# Patient Record
Sex: Female | Born: 1935 | Race: Black or African American | Hispanic: No | Smoking: Former smoker
Health system: Southern US, Community
[De-identification: ages and names within clinical notes are randomized; demographics above are authoritative.]

## PROBLEM LIST (undated history)

## (undated) DIAGNOSIS — H3530 Unspecified macular degeneration: Secondary | ICD-10-CM

## (undated) DIAGNOSIS — R609 Edema, unspecified: Secondary | ICD-10-CM

## (undated) DIAGNOSIS — H33302 Unspecified retinal break, left eye: Secondary | ICD-10-CM

## (undated) DIAGNOSIS — Z5189 Encounter for other specified aftercare: Secondary | ICD-10-CM

## (undated) DIAGNOSIS — K08109 Complete loss of teeth, unspecified cause, unspecified class: Secondary | ICD-10-CM

## (undated) DIAGNOSIS — E785 Hyperlipidemia, unspecified: Secondary | ICD-10-CM

## (undated) DIAGNOSIS — N189 Chronic kidney disease, unspecified: Secondary | ICD-10-CM

## (undated) DIAGNOSIS — D649 Anemia, unspecified: Secondary | ICD-10-CM

## (undated) DIAGNOSIS — R112 Nausea with vomiting, unspecified: Secondary | ICD-10-CM

## (undated) DIAGNOSIS — Z87448 Personal history of other diseases of urinary system: Secondary | ICD-10-CM

## (undated) DIAGNOSIS — R7303 Prediabetes: Secondary | ICD-10-CM

## (undated) DIAGNOSIS — Z972 Presence of dental prosthetic device (complete) (partial): Secondary | ICD-10-CM

## (undated) DIAGNOSIS — K219 Gastro-esophageal reflux disease without esophagitis: Secondary | ICD-10-CM

## (undated) DIAGNOSIS — I10 Essential (primary) hypertension: Secondary | ICD-10-CM

## (undated) DIAGNOSIS — C801 Malignant (primary) neoplasm, unspecified: Secondary | ICD-10-CM

## (undated) DIAGNOSIS — Z9889 Other specified postprocedural states: Secondary | ICD-10-CM

## (undated) HISTORY — DX: Hyperlipidemia, unspecified: E78.5

## (undated) HISTORY — DX: Encounter for other specified aftercare: Z51.89

## (undated) HISTORY — DX: Gastro-esophageal reflux disease without esophagitis: K21.9

## (undated) HISTORY — DX: Unspecified macular degeneration: H35.30

## (undated) HISTORY — DX: Chronic kidney disease, unspecified: N18.9

## (undated) HISTORY — DX: Anemia, unspecified: D64.9

## (undated) HISTORY — DX: Essential (primary) hypertension: I10

## (undated) HISTORY — DX: Unspecified retinal break, left eye: H33.302

---

## 1975-08-28 HISTORY — PX: ABDOMINAL HYSTERECTOMY: SHX81

## 2000-02-05 ENCOUNTER — Encounter: Payer: Self-pay | Admitting: Internal Medicine

## 2000-02-05 ENCOUNTER — Encounter: Admission: RE | Admit: 2000-02-05 | Discharge: 2000-02-05 | Payer: Self-pay | Admitting: Internal Medicine

## 2000-06-21 ENCOUNTER — Ambulatory Visit (HOSPITAL_COMMUNITY): Admission: RE | Admit: 2000-06-21 | Discharge: 2000-06-21 | Payer: Self-pay | Admitting: Gastroenterology

## 2000-06-21 ENCOUNTER — Encounter: Payer: Self-pay | Admitting: Gastroenterology

## 2001-02-05 ENCOUNTER — Encounter: Admission: RE | Admit: 2001-02-05 | Discharge: 2001-02-05 | Payer: Self-pay | Admitting: Internal Medicine

## 2001-02-05 ENCOUNTER — Encounter: Payer: Self-pay | Admitting: Internal Medicine

## 2001-05-01 ENCOUNTER — Other Ambulatory Visit: Admission: RE | Admit: 2001-05-01 | Discharge: 2001-05-01 | Payer: Self-pay | Admitting: Obstetrics and Gynecology

## 2002-02-09 ENCOUNTER — Encounter: Payer: Self-pay | Admitting: Internal Medicine

## 2002-02-09 ENCOUNTER — Encounter: Admission: RE | Admit: 2002-02-09 | Discharge: 2002-02-09 | Payer: Self-pay | Admitting: Internal Medicine

## 2002-06-17 ENCOUNTER — Other Ambulatory Visit: Admission: RE | Admit: 2002-06-17 | Discharge: 2002-06-17 | Payer: Self-pay | Admitting: Obstetrics and Gynecology

## 2003-02-15 ENCOUNTER — Encounter: Payer: Self-pay | Admitting: Internal Medicine

## 2003-02-15 ENCOUNTER — Encounter: Admission: RE | Admit: 2003-02-15 | Discharge: 2003-02-15 | Payer: Self-pay | Admitting: Internal Medicine

## 2004-02-17 ENCOUNTER — Encounter: Admission: RE | Admit: 2004-02-17 | Discharge: 2004-02-17 | Payer: Self-pay | Admitting: Internal Medicine

## 2005-03-02 ENCOUNTER — Encounter: Admission: RE | Admit: 2005-03-02 | Discharge: 2005-03-02 | Payer: Self-pay | Admitting: Internal Medicine

## 2006-03-04 ENCOUNTER — Encounter: Admission: RE | Admit: 2006-03-04 | Discharge: 2006-03-04 | Payer: Self-pay | Admitting: Internal Medicine

## 2007-02-19 ENCOUNTER — Ambulatory Visit: Payer: Self-pay | Admitting: Gastroenterology

## 2007-03-06 ENCOUNTER — Encounter: Admission: RE | Admit: 2007-03-06 | Discharge: 2007-03-06 | Payer: Self-pay | Admitting: Internal Medicine

## 2007-03-12 ENCOUNTER — Encounter: Payer: Self-pay | Admitting: Gastroenterology

## 2007-03-12 ENCOUNTER — Ambulatory Visit: Payer: Self-pay | Admitting: Gastroenterology

## 2008-03-09 ENCOUNTER — Encounter: Admission: RE | Admit: 2008-03-09 | Discharge: 2008-03-09 | Payer: Self-pay | Admitting: Internal Medicine

## 2009-03-10 ENCOUNTER — Encounter: Admission: RE | Admit: 2009-03-10 | Discharge: 2009-03-10 | Payer: Self-pay | Admitting: Internal Medicine

## 2010-03-20 ENCOUNTER — Encounter: Admission: RE | Admit: 2010-03-20 | Discharge: 2010-03-20 | Payer: Self-pay | Admitting: Internal Medicine

## 2010-10-31 ENCOUNTER — Observation Stay (HOSPITAL_COMMUNITY)
Admission: RE | Admit: 2010-10-31 | Discharge: 2010-10-31 | Disposition: A | Discharge status: Home / Self Care | Payer: Medicare Other | Source: Ambulatory Visit | Attending: Cardiology | Admitting: Cardiology

## 2010-10-31 DIAGNOSIS — I10 Essential (primary) hypertension: Secondary | ICD-10-CM | POA: Insufficient documentation

## 2010-10-31 DIAGNOSIS — I059 Rheumatic mitral valve disease, unspecified: Secondary | ICD-10-CM | POA: Insufficient documentation

## 2010-10-31 DIAGNOSIS — E7800 Pure hypercholesterolemia, unspecified: Secondary | ICD-10-CM | POA: Insufficient documentation

## 2010-10-31 DIAGNOSIS — E785 Hyperlipidemia, unspecified: Secondary | ICD-10-CM | POA: Insufficient documentation

## 2010-10-31 DIAGNOSIS — R0602 Shortness of breath: Secondary | ICD-10-CM | POA: Insufficient documentation

## 2010-10-31 DIAGNOSIS — I428 Other cardiomyopathies: Principal | ICD-10-CM | POA: Insufficient documentation

## 2010-10-31 LAB — POCT I-STAT 3, ART BLOOD GAS (G3+): pH, Arterial: 7.429 — ABNORMAL HIGH (ref 7.350–7.400)

## 2010-10-31 LAB — POCT I-STAT 3, VENOUS BLOOD GAS (G3P V)
Acid-Base Excess: 4 mmol/L — ABNORMAL HIGH (ref 0.0–2.0)
Bicarbonate: 29.6 mEq/L — ABNORMAL HIGH (ref 20.0–24.0)
TCO2: 31 mmol/L (ref 0–100)
pH, Ven: 7.425 — ABNORMAL HIGH (ref 7.250–7.300)

## 2010-11-20 NOTE — Procedures (Signed)
Janet Barnes NO.:  [id number]  MEDICAL RECORD NO.:  [id number]           PATIENT TYPE:  I  LOCATION:  6522                         FACILITY:  [facility]  PHYSICIAN:  Vonna Kotyk R. Jacinto Halim, MD       DATE OF BIRTH:  [birth year]  DATE OF PROCEDURE:  10/31/2010 DATE OF DISCHARGE:                           CARDIAC CATHETERIZATION   REFERRING PHYSICIAN:  Merlene Laughter. Renae Gloss, MD  PROCEDURE PERFORMED: 1. Right heart catheterization. 2. Left heart catheterization including,     a.     Left ventriculography.     b.     Selective right and left coronary arteriography.  INDICATIONS:  [name] is a very pleasant 77 year old young female who has mild hypertension, hyperlipidemia who was referred to me for evaluation of shortness of breath.  She had undergone outpatient stress testing and echocardiogram, which had revealed severe LV systolic dysfunction, moderately severe mitral regurgitation.  Given this, she is now brought to the cardiac catheterization lab to evaluate her coronary anatomy.  RIGHT HEART CATHETERIZATION:  Hemodynamic data:  RA pressure was 8/3 with a mean of 5 mmHg.  RV pressure 18/3 with an end-diastolic pressure of 5 mmHg.  PA pressure 16/5 with a mean of 12 mmHg.  PA saturation was 68%.  Pulmonary capillary wedge 8/8, mean of 7 mmHg.  Aortic saturation was 96%.  Cardiac output by Fick 4.69 with a cardiac index at 2.61, which is normal.  HEMODYNAMIC DATA:  Left heart catheterization.  Left ventricular pressure was 126/4 with end-diastolic pressure of 7 mmHg.  Aortic pressure was 124/65 with a mean of 88 mmHg.  There was no pressure gradient across the aortic valve.  ANGIOGRAPHIC DATA:  Left ventricle.  Left ventricular systolic function was moderately depressed with ejection fraction of 35% to 40% with mild global hypokinesis.  Mild mitral regurgitation was evident.  A total of 36 mL of contrast was utilized for performing left  ventriculography. Ectopy was also evident.  Right coronary artery.  Right coronary artery is codominant with circumflex coronary artery.  There is mild luminal irregularity in the proximal right coronary artery.  Left main coronary artery.  Left main coronary artery is a large vessel, which is smooth and normal.  LAD.  LAD is a large vessel.  It gives origin to large diagonal one and a moderate-sized diagonal two.  Smooth and normal.  Ramus intermedius.  Ramus intermedius is a moderate-caliber vessel, which is smooth and normal.  Left circumflex.  Left circumflex coronary artery is codominant with right coronary artery.  It gives origin to a high AV groove branch.  It continues as a large obtuse marginal one, which has secondary branches. Again mild luminal irregularity was evident.  IMPRESSION: 1. Findings are consistent with nonischemic cardiomyopathy.  Ejection     fraction 35% to 40%. 2. No evidence of pulmonary hypertension by right heart     catheterization.  Preserved cardiac output and cardiac index. 3. Normal left ventricular end-diastolic pressure.  The patient is     well compensated in regards to congestive heart failure.  RECOMMENDATIONS:  Based on the data, continued aggressive medical  therapy is indicated.  The patient will be followed up in the outpatient basis.  TECHNICAL PROCEDURE:  Under sterile precaution using a 5-French right antecubital vein access and a 5-French right radial access, right and left heart catheterization was performed respectively via these sites. A 5-French balloon-tipped Swan-Ganz catheter was advanced into the right atrium and right ventricle and eventually into the pulmonary capillary wedge position without any complication.  Right heart catheterization was performed.  Hemodynamics were carefully analyzed and the catheter was then left in place until the left heart catheterization was done and then eventually pulled out of the body  holding manual pressure into the right antecubital vein.  Left heart catheterization was performed using a 5-French Tig #4 catheter.  Left ventriculography performed using hand contrast injection, then eventually the catheter was pulled into the ascending aorta.  Right coronary artery was selectively engaged and angiography was performed and the catheter was pulled out of the body over an exchange length J-wire.  A 5-French Judkins left four diagnostic catheter was utilized to engage left main coronary artery and angiography was performed.  Catheter was then pulled out of the body over a J-wire.  Left ventriculography was again repeated using a 5- French pigtail catheter in which the catheter exchange was again done over a J-wire.  RAO projection was utilized.  The patient tolerated the procedure well.  There was no immediate complication.     Cristy Hilts. Jacinto Halim, MD     JRG/MEDQ  D:  10/31/2010  T:  10/31/2010  Job:  [id number]  cc:   Merlene Laughter. Renae Gloss, M.D.  Electronically Signed by Yates Decamp MD on 11/20/2010 09:48:50 AM

## 2011-02-09 ENCOUNTER — Other Ambulatory Visit: Payer: Self-pay | Admitting: Internal Medicine

## 2011-02-09 DIAGNOSIS — Z1231 Encounter for screening mammogram for malignant neoplasm of breast: Secondary | ICD-10-CM

## 2011-03-22 ENCOUNTER — Ambulatory Visit
Admission: RE | Admit: 2011-03-22 | Discharge: 2011-03-22 | Disposition: A | Discharge status: Home / Self Care | Payer: BC Managed Care – PPO | Source: Ambulatory Visit | Attending: Internal Medicine | Admitting: Internal Medicine

## 2011-03-22 DIAGNOSIS — Z1231 Encounter for screening mammogram for malignant neoplasm of breast: Secondary | ICD-10-CM

## 2011-08-28 DIAGNOSIS — H33302 Unspecified retinal break, left eye: Secondary | ICD-10-CM

## 2011-08-28 HISTORY — DX: Unspecified retinal break, left eye: H33.302

## 2012-02-22 ENCOUNTER — Other Ambulatory Visit: Payer: Self-pay | Admitting: Internal Medicine

## 2012-02-22 DIAGNOSIS — Z1231 Encounter for screening mammogram for malignant neoplasm of breast: Secondary | ICD-10-CM

## 2012-03-06 ENCOUNTER — Encounter: Payer: Self-pay | Admitting: Gastroenterology

## 2012-04-03 ENCOUNTER — Ambulatory Visit (AMBULATORY_SURGERY_CENTER): Payer: BC Managed Care – PPO | Admitting: *Deleted

## 2012-04-03 VITALS — Ht 63.0 in | Wt 185.0 lb

## 2012-04-03 DIAGNOSIS — Z1211 Encounter for screening for malignant neoplasm of colon: Secondary | ICD-10-CM

## 2012-04-03 MED ORDER — MOVIPREP 100 G PO SOLR: ORAL | Status: DC

## 2012-04-07 ENCOUNTER — Ambulatory Visit
Admission: RE | Admit: 2012-04-07 | Discharge: 2012-04-07 | Disposition: A | Discharge status: Home / Self Care | Payer: Medicare Other | Source: Ambulatory Visit | Attending: Internal Medicine | Admitting: Internal Medicine

## 2012-04-07 DIAGNOSIS — Z1231 Encounter for screening mammogram for malignant neoplasm of breast: Secondary | ICD-10-CM

## 2012-04-14 ENCOUNTER — Ambulatory Visit (AMBULATORY_SURGERY_CENTER): Payer: Medicare Other | Admitting: Gastroenterology

## 2012-04-14 ENCOUNTER — Encounter: Payer: Self-pay | Admitting: Gastroenterology

## 2012-04-14 VITALS — BP 145/70 | HR 63 | Temp 96.8°F | Resp 17 | Ht 63.0 in | Wt 185.0 lb

## 2012-04-14 DIAGNOSIS — K5730 Diverticulosis of large intestine without perforation or abscess without bleeding: Secondary | ICD-10-CM

## 2012-04-14 DIAGNOSIS — Z1211 Encounter for screening for malignant neoplasm of colon: Secondary | ICD-10-CM

## 2012-04-14 DIAGNOSIS — Z86010 Personal history of colonic polyps: Secondary | ICD-10-CM

## 2012-04-14 MED ORDER — SODIUM CHLORIDE 0.9 % IV SOLN: 500.0000 mL | INTRAVENOUS | Status: DC

## 2012-04-14 NOTE — Progress Notes (Signed)
Patient did not have preoperative order for IV antibiotic SSI prophylaxis. (G8918)  Patient did not experience any of the following events: a burn prior to discharge; a fall within the facility; wrong site/side/patient/procedure/implant event; or a hospital transfer or hospital admission upon discharge from the facility. (G8907)  

## 2012-04-14 NOTE — Op Note (Signed)
[facility] [address] [place], [address]   COLONOSCOPY PROCEDURE REPORT  PATIENT: Janet, Barnes  MR#: [id number] BIRTHDATE: [birth year] , 78  yrs. old GENDER: Female ENDOSCOPIST: Mardella Layman, MD, [facility] REFERRED BY: PROCEDURE DATE:  04/14/2012 PROCEDURE:   Colonoscopy, surveillance ASA CLASS:   Class II INDICATIONS:high risk patient with personal history of colonic polyps. MEDICATIONS: mg  DESCRIPTION OF PROCEDURE:   After the risks and benefits and of the procedure were explained, informed consent was obtained.  A digital rectal exam revealed no abnormalities of the rectum.   The LB CF-[id number]  endoscope was introduced through the anus and advanced to the cecum, which was identified by both the appendix and ileocecal valve .  The quality of the prep was excellent, using MoviPrep .  The instrument was then slowly withdrawn as the colon was fully examined.      COLON FINDINGS: There was severe diverticulosis noted throughout the entire examined colon with associated muscular hypertrophy.   No polyps or cancer.    Retroflexed views revealed no abnormalities. The scope was then withdrawn from the patient and the procedure completed.  COMPLICATIONS: There were no complications.  ENDOSCOPIC IMPRESSION: 1.   There was severe diverticulosis noted throughout the entire examined colon 2.   No polyps or cancer  RECOMMENDATIONS: 1.  Continue current medications 2.  High fiber diet. 3.  Repeat Colonoscopy in 5 years. 4.   ++ fh colon polyps   REPEAT EXAM:    _______________________________ eSignedMardella Layman, MD, [facility] 04/14/2012 11:55 AM     PATIENT NAME:  Janet Barnes MR#: [id number]

## 2012-04-14 NOTE — Patient Instructions (Signed)
YOU HAD AN ENDOSCOPIC PROCEDURE TODAY AT THE [facility]: Refer to the procedure report that was given to you for any specific questions about what was found during the examination.  If the procedure report does not answer your questions, please call your gastroenterologist to clarify.  If you requested that your care partner not be given the details of your procedure findings, then the procedure report has been included in a sealed envelope for you to review at your convenience later.  YOU SHOULD EXPECT: Some feelings of bloating in the abdomen. Passage of more gas than usual.  Walking can help get rid of the air that was put into your GI tract during the procedure and reduce the bloating. If you had a lower endoscopy (such as a colonoscopy or flexible sigmoidoscopy) you may notice spotting of blood in your stool or on the toilet paper. If you underwent a bowel prep for your procedure, then you may not have a normal bowel movement for a few days.  DIET: Your first meal following the procedure should be a light meal and then it is ok to progress to your normal diet.  A half-sandwich or bowl of soup is an example of a good first meal.  Heavy or fried foods are harder to digest and may make you feel nauseous or bloated.  Likewise meals heavy in dairy and vegetables can cause extra gas to form and this can also increase the bloating.  Drink plenty of fluids but you should avoid alcoholic beverages for 24 hours.  ACTIVITY: Your care partner should take you home directly after the procedure.  You should plan to take it easy, moving slowly for the rest of the day.  You can resume normal activity the day after the procedure however you should NOT DRIVE or use heavy machinery for 24 hours (because of the sedation medicines used during the test).    SYMPTOMS TO REPORT IMMEDIATELY: A gastroenterologist can be reached at any hour.  During normal business hours, 8:30 AM to 5:00 PM Monday through Friday,  call [phone].  After hours and on weekends, please call the GI answering service at [phone] who will take a message and have the physician on call contact you.   Following lower endoscopy (colonoscopy or flexible sigmoidoscopy):  Excessive amounts of blood in the stool  Significant tenderness or worsening of abdominal pains  Swelling of the abdomen that is new, acute  Fever of 100F or higher   FOLLOW UP: If any biopsies were taken you will be contacted by phone or by letter within the next 1-3 weeks.  Call your gastroenterologist if you have not heard about the biopsies in 3 weeks.  Our staff will call the home number listed on your records the next business day following your procedure to check on you and address any questions or concerns that you may have at that time regarding the information given to you following your procedure. This is a courtesy call and so if there is no answer at the home number and we have not heard from you through the emergency physician on call, we will assume that you have returned to your regular daily activities without incident.  SIGNATURES/CONFIDENTIALITY: You and/or your care partner have signed paperwork which will be entered into your electronic medical record.  These signatures attest to the fact that that the information above on your After Visit Summary has been reviewed and is understood.  Full responsibility of the confidentiality of   this discharge information lies with you and/or your care-partner.    INFORMATION ON DIVERTICULOSIS & HIGH FIBER DIET GIVEN TO YOU TODAY 

## 2012-04-14 NOTE — Progress Notes (Signed)
Propofol per [facility] crna. See scanned intra procedure report.  All meds titrated per crna during procedure. ewm 

## 2012-04-15 ENCOUNTER — Telehealth: Payer: Self-pay

## 2012-04-15 NOTE — Telephone Encounter (Signed)
Left message

## 2013-03-26 ENCOUNTER — Other Ambulatory Visit: Payer: Self-pay

## 2013-03-26 DIAGNOSIS — Z1231 Encounter for screening mammogram for malignant neoplasm of breast: Secondary | ICD-10-CM

## 2013-04-15 ENCOUNTER — Ambulatory Visit
Admission: RE | Admit: 2013-04-15 | Discharge: 2013-04-15 | Disposition: A | Discharge status: Home / Self Care | Payer: Medicare Other | Source: Ambulatory Visit

## 2013-04-15 DIAGNOSIS — Z1231 Encounter for screening mammogram for malignant neoplasm of breast: Secondary | ICD-10-CM

## 2014-03-31 ENCOUNTER — Other Ambulatory Visit: Payer: Self-pay

## 2014-03-31 DIAGNOSIS — Z1231 Encounter for screening mammogram for malignant neoplasm of breast: Secondary | ICD-10-CM

## 2014-04-19 ENCOUNTER — Encounter (INDEPENDENT_AMBULATORY_CARE_PROVIDER_SITE_OTHER): Payer: Self-pay

## 2014-04-19 ENCOUNTER — Ambulatory Visit
Admission: RE | Admit: 2014-04-19 | Discharge: 2014-04-19 | Disposition: A | Discharge status: Home / Self Care | Payer: Medicare Other | Source: Ambulatory Visit

## 2014-04-19 DIAGNOSIS — Z1231 Encounter for screening mammogram for malignant neoplasm of breast: Secondary | ICD-10-CM

## 2015-02-16 ENCOUNTER — Encounter: Payer: Self-pay | Admitting: Gastroenterology

## 2015-03-17 ENCOUNTER — Other Ambulatory Visit: Payer: Self-pay

## 2015-03-17 DIAGNOSIS — Z1231 Encounter for screening mammogram for malignant neoplasm of breast: Secondary | ICD-10-CM

## 2015-04-26 ENCOUNTER — Ambulatory Visit: Payer: Self-pay

## 2015-05-11 ENCOUNTER — Ambulatory Visit
Admission: RE | Admit: 2015-05-11 | Discharge: 2015-05-11 | Disposition: A | Discharge status: Home / Self Care | Payer: Medicare Other | Source: Ambulatory Visit

## 2015-05-11 DIAGNOSIS — Z1231 Encounter for screening mammogram for malignant neoplasm of breast: Secondary | ICD-10-CM

## 2016-03-30 ENCOUNTER — Other Ambulatory Visit: Payer: Self-pay | Admitting: Internal Medicine

## 2016-03-30 DIAGNOSIS — Z1231 Encounter for screening mammogram for malignant neoplasm of breast: Secondary | ICD-10-CM

## 2016-05-14 ENCOUNTER — Ambulatory Visit
Admission: RE | Admit: 2016-05-14 | Discharge: 2016-05-14 | Disposition: A | Discharge status: Home / Self Care | Payer: Medicare Other | Source: Ambulatory Visit | Attending: Internal Medicine | Admitting: Internal Medicine

## 2016-05-14 DIAGNOSIS — Z1231 Encounter for screening mammogram for malignant neoplasm of breast: Secondary | ICD-10-CM

## 2016-12-27 ENCOUNTER — Other Ambulatory Visit: Payer: Self-pay | Admitting: Internal Medicine

## 2016-12-27 DIAGNOSIS — E2839 Other primary ovarian failure: Secondary | ICD-10-CM

## 2017-01-04 ENCOUNTER — Ambulatory Visit
Admission: RE | Admit: 2017-01-04 | Discharge: 2017-01-04 | Disposition: A | Discharge status: Home / Self Care | Payer: Medicare Other | Source: Ambulatory Visit | Attending: Internal Medicine | Admitting: Internal Medicine

## 2017-01-04 DIAGNOSIS — E2839 Other primary ovarian failure: Secondary | ICD-10-CM

## 2017-02-04 ENCOUNTER — Encounter: Payer: Self-pay | Admitting: *Deleted

## 2017-04-10 ENCOUNTER — Other Ambulatory Visit: Payer: Self-pay | Admitting: Internal Medicine

## 2017-04-10 DIAGNOSIS — Z1231 Encounter for screening mammogram for malignant neoplasm of breast: Secondary | ICD-10-CM

## 2017-05-16 ENCOUNTER — Ambulatory Visit
Admission: RE | Admit: 2017-05-16 | Discharge: 2017-05-16 | Disposition: A | Discharge status: Home / Self Care | Payer: Medicare Other | Source: Ambulatory Visit | Attending: Internal Medicine | Admitting: Internal Medicine

## 2017-05-16 DIAGNOSIS — Z1231 Encounter for screening mammogram for malignant neoplasm of breast: Secondary | ICD-10-CM

## 2017-05-17 ENCOUNTER — Other Ambulatory Visit: Payer: Self-pay | Admitting: Internal Medicine

## 2017-05-17 DIAGNOSIS — R928 Other abnormal and inconclusive findings on diagnostic imaging of breast: Secondary | ICD-10-CM

## 2017-05-22 ENCOUNTER — Ambulatory Visit
Admission: RE | Admit: 2017-05-22 | Discharge: 2017-05-22 | Disposition: A | Discharge status: Home / Self Care | Payer: Medicare Other | Source: Ambulatory Visit | Attending: Internal Medicine | Admitting: Internal Medicine

## 2017-05-22 ENCOUNTER — Other Ambulatory Visit: Payer: Self-pay | Admitting: Internal Medicine

## 2017-05-22 DIAGNOSIS — R928 Other abnormal and inconclusive findings on diagnostic imaging of breast: Secondary | ICD-10-CM

## 2017-05-22 DIAGNOSIS — R921 Mammographic calcification found on diagnostic imaging of breast: Secondary | ICD-10-CM

## 2017-05-29 ENCOUNTER — Ambulatory Visit
Admission: RE | Admit: 2017-05-29 | Discharge: 2017-05-29 | Disposition: A | Discharge status: Home / Self Care | Payer: Medicare Other | Source: Ambulatory Visit | Attending: Internal Medicine | Admitting: Internal Medicine

## 2017-05-29 ENCOUNTER — Other Ambulatory Visit: Payer: Self-pay | Admitting: Internal Medicine

## 2017-05-29 DIAGNOSIS — R921 Mammographic calcification found on diagnostic imaging of breast: Secondary | ICD-10-CM

## 2017-08-01 ENCOUNTER — Encounter: Payer: Self-pay | Admitting: Internal Medicine

## 2017-08-13 ENCOUNTER — Telehealth: Payer: Self-pay | Admitting: Internal Medicine

## 2017-08-13 NOTE — Telephone Encounter (Signed)
Pt states she started having abdominal pain in her upper abdomen follow by vomiting and diarrhea yesterday. Pt states the pain is still there but the other symptoms have stopped. Discussed with pt that she may have had a virus. Pt would like to be seen, states the pain is still present and she is due for a colon. Pt scheduled to see Ellouise Newer PA 08/28/17@3 :15pm. Pt aware of appt.

## 2017-08-28 ENCOUNTER — Encounter (INDEPENDENT_AMBULATORY_CARE_PROVIDER_SITE_OTHER): Payer: Self-pay

## 2017-08-28 ENCOUNTER — Other Ambulatory Visit (INDEPENDENT_AMBULATORY_CARE_PROVIDER_SITE_OTHER): Payer: Medicare Other

## 2017-08-28 ENCOUNTER — Ambulatory Visit: Payer: Medicare Other | Admitting: Physician Assistant

## 2017-08-28 ENCOUNTER — Encounter: Payer: Self-pay | Admitting: Physician Assistant

## 2017-08-28 VITALS — BP 138/66 | HR 84 | Ht 63.0 in | Wt 180.0 lb

## 2017-08-28 DIAGNOSIS — R1013 Epigastric pain: Secondary | ICD-10-CM

## 2017-08-28 DIAGNOSIS — R112 Nausea with vomiting, unspecified: Secondary | ICD-10-CM | POA: Diagnosis not present

## 2017-08-28 DIAGNOSIS — Z8371 Family history of colonic polyps: Secondary | ICD-10-CM

## 2017-08-28 DIAGNOSIS — Z83719 Family history of colon polyps, unspecified: Secondary | ICD-10-CM

## 2017-08-28 LAB — COMPREHENSIVE METABOLIC PANEL
ALT: 20 U/L (ref 0–35)
AST: 22 U/L (ref 0–37)
Albumin: 3.7 g/dL (ref 3.5–5.2)
Alkaline Phosphatase: 50 U/L (ref 39–117)
BUN: 18 mg/dL (ref 6–23)
CALCIUM: 8.7 mg/dL (ref 8.4–10.5)
CHLORIDE: 103 meq/L (ref 96–112)
CO2: 26 meq/L (ref 19–32)
CREATININE: 1.42 mg/dL — AB (ref 0.40–1.20)
GFR: 45.59 mL/min — ABNORMAL LOW (ref >60.00)
Glucose, Bld: 110 mg/dL — ABNORMAL HIGH (ref 70–99)
Potassium: 3.9 mEq/L (ref 3.5–5.1)
SODIUM: 136 meq/L (ref 135–145)
Total Bilirubin: 0.3 mg/dL (ref 0.2–1.2)
Total Protein: 7.6 g/dL (ref 6.0–8.3)

## 2017-08-28 LAB — CBC WITH DIFFERENTIAL/PLATELET
BASOS PCT: 0.6 % (ref 0.0–3.0)
Basophils Absolute: 0.1 10*3/uL (ref 0.0–0.1)
EOS ABS: 0.5 10*3/uL (ref 0.0–0.7)
EOS PCT: 5.8 % — AB (ref 0.0–5.0)
HCT: 26.6 % — ABNORMAL LOW (ref 36.0–46.0)
Hemoglobin: 8.5 g/dL — ABNORMAL LOW (ref 12.0–15.0)
LYMPHS PCT: 13.3 % (ref 12.0–46.0)
Lymphs Abs: 1.2 10*3/uL (ref 0.7–4.0)
MCHC: 32 g/dL (ref 30.0–36.0)
MCV: 81.6 fl (ref 78.0–100.0)
Monocytes Absolute: 0.7 10*3/uL (ref 0.1–1.0)
Monocytes Relative: 7 % (ref 3.0–12.0)
NEUTROS ABS: 6.9 10*3/uL (ref 1.4–7.7)
Neutrophils Relative %: 73.3 % (ref 43.0–77.0)
PLATELETS: 506 10*3/uL — AB (ref 150.0–400.0)
RBC: 3.26 Mil/uL — ABNORMAL LOW (ref 3.87–5.11)
RDW: 16.6 % — AB (ref 11.5–15.5)
WBC: 9.4 10*3/uL (ref 4.0–10.5)

## 2017-08-28 LAB — LIPASE: Lipase: 44 U/L (ref 11.0–59.0)

## 2017-08-28 MED ORDER — PANTOPRAZOLE SODIUM 40 MG PO TBEC: 40.0000 mg | DELAYED_RELEASE_TABLET | Freq: Every day | ORAL | 1 refills | Status: AC

## 2017-08-28 NOTE — Patient Instructions (Signed)
We have sent the following medications to your pharmacy for you to pick up at your convenience:  Pantoprazole 40 mg daily 30-60 minutes before breakfast

## 2017-08-28 NOTE — Progress Notes (Addendum)
Chief Complaint: Epigastric pain, Family history of colon polyps  HPI:    [name] is an 84 year old African female with a past medical history as listed below, who previously followed with Dr. Sharlett Iles, who was referred to me by Willey Blade, MD for a complaint of epigastric abdominal pain.     Patient's last colonoscopy was performed 04/14/12 with Dr. Sharlett Iles with a finding of severe diverticulosis throughout the entire examined colon and no polyps or cancer.  Repeat was recommended in 5 years due to patient's family history of colon polyps.  Patient was sent a letter from our office prior to scheduling her colonoscopy as directed.  This was sent by Dr. Hilarie Fredrickson.    Patient called our office 08/13/17 and described upper abdominal pain followed by vomiting and diarrhea the day prior.  She had resolution of her vomiting and diarrhea but her pain continued.  She requested an office visit.    Today, the patient presents to clinic and explains that about a month ago she had an episode of nausea and vomiting for about a 48-hour time period.  As well as some diarrhea and pain.  Patient tells me that she has had no further nausea, vomiting or diarrhea but has continued with an epigastric pain.  She blames this on using some "codeine syrup" which she was taking for an episode of bronchitis.  Patient tells me that she has continued, this is "nagging/ bloated" epigastric feeling which is worse after eating anything.  Patient tells me that when she eats she will develop an intense bloating sensation.  For the past 2 weeks she has just been able to tolerate soup and soft foods.  Patient denies any overt heartburn or reflux symptoms or dysphasia.  She did start a probiotic about a month ago but has noticed no change in her symptoms.    Patient also briefly discusses her colonoscopy today.  She would like to proceed with this when she is able.    Patient denies fever, chills, blood in her stool, melena,  weight loss, anorexia, change in bowel habits or symptoms that awaken him at her at night.  Past Medical History:  Diagnosis Date  . Allergy    seasonal  . GERD (gastroesophageal reflux disease)   . Hyperlipidemia   . Hypertension   . Retinal break of left eye 2013    Past Surgical History:  Procedure Laterality Date  . ABDOMINAL HYSTERECTOMY  1977    Current Outpatient Medications  Medication Sig Dispense Refill  . BYSTOLIC 10 MG tablet Take 1 tablet by mouth Daily.    . Cholecalciferol (VITAMIN D) 2000 UNITS tablet Take 2,000 Units by mouth daily.    . Coenzyme Q10 (CO Q 10) 60 MG CAPS Take 1 capsule by mouth daily.    . Red Yeast Rice Extract (RED YEAST RICE PO) Take 1 tablet by mouth daily.    Marland Kitchen spironolactone (ALDACTONE) 25 MG tablet Take 12.5 mg by mouth Daily.     No current facility-administered medications for this visit.     Allergies as of 08/28/2017 - Review Complete 04/14/2012  Allergen Reaction Noted  . Sulfur  08/28/2017    Family History  Problem Relation Age of Onset  . Colon cancer Mother   . Heart disease Father     Social History   Socioeconomic History  . Marital status: Widowed    Spouse name: Not on file  . Number of children: Not on file  . Years of  education: Not on file  . Highest education level: Not on file  Social Needs  . Financial resource strain: Not on file  . Food insecurity - worry: Not on file  . Food insecurity - inability: Not on file  . Transportation needs - medical: Not on file  . Transportation needs - non-medical: Not on file  Occupational History  . Not on file  Tobacco Use  . Smoking status: Former Research scientist (life sciences)  . Smokeless tobacco: Never Used  Substance and Sexual Activity  . Alcohol use: No  . Drug use: No  . Sexual activity: Not on file  Other Topics Concern  . Not on file  Social History Narrative  . Not on file    Review of Systems:    Constitutional: No weight loss, fever or chills Skin: No  rash Cardiovascular: No chest pain Respiratory: No SOB  Gastrointestinal: See HPI and otherwise negative Genitourinary: No dysuria Neurological: No headache Musculoskeletal: No new muscle or joint pain Hematologic: No bleeding  Psychiatric: No history of depression or anxiety   Physical Exam:  Vital signs: BP 138/66   Pulse 84   Ht 5\' 3"  (1.6 m)   Wt 180 lb (81.6 kg)   BMI 31.89 kg/m     Constitutional:   Pleasant AA female appears to be in NAD, Well developed, Well nourished, alert and cooperative Head:  Normocephalic and atraumatic. Eyes:   PEERL, EOMI. No icterus. Conjunctiva pink. Ears:  Normal auditory acuity. Neck:  Supple Throat: Oral cavity and pharynx without inflammation, swelling or lesion.  Respiratory: Respirations even and unlabored. Lungs clear to auscultation bilaterally.   No wheezes, crackles, or rhonchi.  Cardiovascular: Normal S1, S2. No MRG. Regular rate and rhythm. No peripheral edema, cyanosis or pallor.  Gastrointestinal:  Soft, nondistended, moderate epigastric ttp. No rebound or guarding. Normal bowel sounds. No appreciable masses or hepatomegaly. Rectal:  Not performed.  Msk:  Symmetrical without gross deformities. Without edema, no deformity or joint abnormality.  Neurologic:  Alert and  oriented x4;  grossly normal neurologically.  Skin:   Dry and intact without significant lesions or rashes. Psychiatric:  Demonstrates good judgement and reason without abnormal affect or behaviors.  No recent labs or imaging.  Assessment: 1. Family History of colon polyps:  last colonoscopy in 2013, recommendations for repeat in 5 years, she would like to proceed with this when she is able 2. Epigastric pain: Over the past month, associated with bloating, worse after eating, started after using codeine cough syrup for bronchitis; question gastritis versus H. pylori versus other 3. Nausea/Vomiting: For 48-hour time period, now with residual epigastric pain, no  further episodes of nausea or vomiting, likely this is viral  Plan: 1.  Started patient on Pantoprazole 40 mg daily, 30-60 minutes before breakfast.  Prescribed #30 with 1 refill 2.  Recommend the patient continue her probiotic for a total of 2 months. 3.  Discussed with the patient that if her epigastric pain has resolved at time of follow-up we will go ahead and schedule her for her surveillance colonoscopy due to her family history of colon polyps.  If it does not resolve then we will schedule her for an EGD and colonoscopy for further evaluation. 4.  Ordered labs to include a CBC, CMP and lipase 5.  Patient to follow in clinic with an APP in 2-3 weeks (I will be out of town).  At that time, patient can be scheduled for procedures as above.  These should be scheduled with  Dr. Hilarie Fredrickson as he has accepted her as a patient after review of her colonoscopy records.  Ellouise Newer, PA-C [facility] 08/28/2017, 3:17 PM  Cc: Willey Blade, MD   Addendum: Reviewed and agree with initial management. Pyrtle, Lajuan Lines, MD

## 2017-08-29 ENCOUNTER — Other Ambulatory Visit (INDEPENDENT_AMBULATORY_CARE_PROVIDER_SITE_OTHER): Payer: Medicare Other

## 2017-08-29 ENCOUNTER — Other Ambulatory Visit: Payer: Medicare Other

## 2017-08-29 ENCOUNTER — Other Ambulatory Visit: Payer: Self-pay

## 2017-08-29 DIAGNOSIS — D649 Anemia, unspecified: Secondary | ICD-10-CM

## 2017-08-29 DIAGNOSIS — D509 Iron deficiency anemia, unspecified: Secondary | ICD-10-CM

## 2017-08-29 LAB — IBC PANEL
Iron: 18 ug/dL — ABNORMAL LOW (ref 42–145)
SATURATION RATIOS: 4.2 % — AB (ref 20.0–50.0)
TRANSFERRIN: 307 mg/dL (ref 212.0–360.0)

## 2017-08-29 LAB — FERRITIN: Ferritin: 15.7 ng/mL (ref 10.0–291.0)

## 2017-09-06 ENCOUNTER — Ambulatory Visit (HOSPITAL_COMMUNITY)
Admission: RE | Admit: 2017-09-06 | Discharge: 2017-09-06 | Disposition: A | Discharge status: Home / Self Care | Payer: Medicare Other | Source: Ambulatory Visit | Attending: Internal Medicine | Admitting: Internal Medicine

## 2017-09-06 DIAGNOSIS — D509 Iron deficiency anemia, unspecified: Secondary | ICD-10-CM

## 2017-09-06 MED ORDER — SODIUM CHLORIDE 0.9 % IV SOLN
510.0000 mg | INTRAVENOUS | Status: DC
  Administered 2017-09-06: 10:00:00 510 mg via INTRAVENOUS
  Filled 2017-09-06: qty 17

## 2017-09-06 NOTE — Discharge Instructions (Signed)

## 2017-09-10 ENCOUNTER — Encounter: Payer: Self-pay | Admitting: Internal Medicine

## 2017-09-10 ENCOUNTER — Other Ambulatory Visit (INDEPENDENT_AMBULATORY_CARE_PROVIDER_SITE_OTHER): Payer: Medicare Other

## 2017-09-10 ENCOUNTER — Ambulatory Visit: Payer: Medicare Other

## 2017-09-10 ENCOUNTER — Other Ambulatory Visit: Payer: Self-pay

## 2017-09-10 VITALS — Ht 63.0 in | Wt 181.2 lb

## 2017-09-10 DIAGNOSIS — D508 Other iron deficiency anemias: Secondary | ICD-10-CM

## 2017-09-10 DIAGNOSIS — D649 Anemia, unspecified: Secondary | ICD-10-CM

## 2017-09-10 LAB — CBC WITH DIFFERENTIAL/PLATELET
BASOS PCT: 0.8 % (ref 0.0–3.0)
Basophils Absolute: 0.1 10*3/uL (ref 0.0–0.1)
EOS ABS: 0.6 10*3/uL (ref 0.0–0.7)
EOS PCT: 6.2 % — AB (ref 0.0–5.0)
HEMATOCRIT: 24.7 % — AB (ref 36.0–46.0)
Hemoglobin: 7.7 g/dL — CL (ref 12.0–15.0)
Lymphocytes Relative: 12.9 % (ref 12.0–46.0)
Lymphs Abs: 1.2 10*3/uL (ref 0.7–4.0)
MCHC: 31.3 g/dL (ref 30.0–36.0)
MCV: 80.1 fl (ref 78.0–100.0)
MONO ABS: 0.7 10*3/uL (ref 0.1–1.0)
Monocytes Relative: 8 % (ref 3.0–12.0)
NEUTROS ABS: 6.4 10*3/uL (ref 1.4–7.7)
Neutrophils Relative %: 72.1 % (ref 43.0–77.0)
PLATELETS: 545 10*3/uL — AB (ref 150.0–400.0)
RBC: 3.09 Mil/uL — ABNORMAL LOW (ref 3.87–5.11)
RDW: 17.8 % — AB (ref 11.5–15.5)
WBC: 8.9 10*3/uL (ref 4.0–10.5)

## 2017-09-10 MED ORDER — PEG 3350-KCL-NA BICARB-NACL 420 G PO SOLR: 4000.0000 mL | Freq: Once | ORAL | 0 refills | Status: AC

## 2017-09-10 NOTE — Progress Notes (Signed)
Denies allergies to eggs or soy products. Denies complication of anesthesia or sedation. Denies use of weight loss medication. Denies use of O2.   Emmi instructions declined.  

## 2017-09-13 ENCOUNTER — Ambulatory Visit (HOSPITAL_COMMUNITY)
Admission: RE | Admit: 2017-09-13 | Discharge: 2017-09-13 | Disposition: A | Discharge status: Home / Self Care | Payer: Medicare Other | Source: Ambulatory Visit | Attending: Internal Medicine | Admitting: Internal Medicine

## 2017-09-13 DIAGNOSIS — D509 Iron deficiency anemia, unspecified: Secondary | ICD-10-CM | POA: Diagnosis present

## 2017-09-13 MED ORDER — SODIUM CHLORIDE 0.9 % IV SOLN
510.0000 mg | INTRAVENOUS | Status: DC
  Administered 2017-09-13: 10:00:00 510 mg via INTRAVENOUS
  Filled 2017-09-13: qty 17

## 2017-09-16 ENCOUNTER — Other Ambulatory Visit (INDEPENDENT_AMBULATORY_CARE_PROVIDER_SITE_OTHER): Payer: Medicare Other

## 2017-09-16 ENCOUNTER — Other Ambulatory Visit: Payer: Self-pay

## 2017-09-16 ENCOUNTER — Encounter: Payer: Self-pay | Admitting: Internal Medicine

## 2017-09-16 ENCOUNTER — Telehealth: Payer: Self-pay

## 2017-09-16 ENCOUNTER — Ambulatory Visit (AMBULATORY_SURGERY_CENTER): Payer: Medicare Other | Admitting: Internal Medicine

## 2017-09-16 VITALS — BP 145/61 | HR 60 | Temp 96.6°F | Resp 16 | Ht 63.0 in | Wt 181.0 lb

## 2017-09-16 DIAGNOSIS — C260 Malignant neoplasm of intestinal tract, part unspecified: Secondary | ICD-10-CM | POA: Diagnosis not present

## 2017-09-16 DIAGNOSIS — K6389 Other specified diseases of intestine: Secondary | ICD-10-CM

## 2017-09-16 DIAGNOSIS — K639 Disease of intestine, unspecified: Secondary | ICD-10-CM

## 2017-09-16 DIAGNOSIS — R1010 Upper abdominal pain, unspecified: Secondary | ICD-10-CM | POA: Diagnosis not present

## 2017-09-16 DIAGNOSIS — D509 Iron deficiency anemia, unspecified: Secondary | ICD-10-CM

## 2017-09-16 LAB — CBC WITH DIFFERENTIAL/PLATELET
BASOS ABS: 0.1 10*3/uL (ref 0.0–0.1)
Basophils Relative: 1 % (ref 0.0–3.0)
Eosinophils Absolute: 0.5 10*3/uL (ref 0.0–0.7)
Eosinophils Relative: 5.3 % — ABNORMAL HIGH (ref 0.0–5.0)
HEMATOCRIT: 27.6 % — AB (ref 36.0–46.0)
Hemoglobin: 8.8 g/dL — ABNORMAL LOW (ref 12.0–15.0)
LYMPHS ABS: 0.8 10*3/uL (ref 0.7–4.0)
LYMPHS PCT: 9.2 % — AB (ref 12.0–46.0)
MCHC: 31.8 g/dL (ref 30.0–36.0)
MCV: 82.2 fl (ref 78.0–100.0)
Monocytes Absolute: 0.7 10*3/uL (ref 0.1–1.0)
Monocytes Relative: 7.5 % (ref 3.0–12.0)
NEUTROS PCT: 77 % (ref 43.0–77.0)
Neutro Abs: 7 10*3/uL (ref 1.4–7.7)
Platelets: 451 10*3/uL — ABNORMAL HIGH (ref 150.0–400.0)
RBC: 3.36 Mil/uL — AB (ref 3.87–5.11)
RDW: 22.9 % — ABNORMAL HIGH (ref 11.5–15.5)
WBC: 9.2 10*3/uL (ref 4.0–10.5)

## 2017-09-16 LAB — BASIC METABOLIC PANEL
BUN: 12 mg/dL (ref 6–23)
CHLORIDE: 102 meq/L (ref 96–112)
CO2: 25 meq/L (ref 19–32)
CREATININE: 1.21 mg/dL — AB (ref 0.40–1.20)
Calcium: 8.6 mg/dL (ref 8.4–10.5)
GFR: 54.83 mL/min — ABNORMAL LOW (ref >60.00)
Glucose, Bld: 114 mg/dL — ABNORMAL HIGH (ref 70–99)
POTASSIUM: 3.9 meq/L (ref 3.5–5.1)
Sodium: 135 mEq/L (ref 135–145)

## 2017-09-16 MED ORDER — SODIUM CHLORIDE 0.9 % IV SOLN: 500.0000 mL | Freq: Once | INTRAVENOUS | Status: DC

## 2017-09-16 NOTE — Telephone Encounter (Signed)
Pt scheduled for CT of CAP at [facility] CT 09/17/17@11 :15am. Pt to be NPO after 7:15am except for drinking bottle 1 of contrast at 9:15am, and bottle 2 at 10:15am. Pt to arrive there at 11am. [place] RN-Angela-to notify pt of appt date and instructions.

## 2017-09-16 NOTE — Progress Notes (Signed)
   F Pt's states no medical or surgical changes since previsit or office visit.    

## 2017-09-16 NOTE — Op Note (Signed)
[facility] Patient Name: Janet Barnes Procedure Date: 09/16/2017 2:33 PM MRN: [id number] Endoscopist: Jerene Bears , MD Age: 84 Referring MD:  Date of Birth: 1934-05-12 Gender: Female Account #: [id number] Procedure:                Colonoscopy Indications:              Upper abdominal pain, Iron deficiency anemia Medicines:                Monitored Anesthesia Care Procedure:                Pre-Anesthesia Assessment:                           - Prior to the procedure, a History and Physical                            was performed, and patient medications and                            allergies were reviewed. The patient's tolerance of                            previous anesthesia was also reviewed. The risks                            and benefits of the procedure and the sedation                            options and risks were discussed with the patient.                            All questions were answered, and informed consent                            was obtained. Prior Anticoagulants: The patient has                            taken no previous anticoagulant or antiplatelet                            agents. ASA Grade Assessment: III - A patient with                            severe systemic disease. After reviewing the risks                            and benefits, the patient was deemed in                            satisfactory condition to undergo the procedure.                           After obtaining informed consent, the colonoscope  was passed under direct vision. Throughout the                            procedure, the patient's blood pressure, pulse, and                            oxygen saturations were monitored continuously. The                            Model PCF-H190DL [id number]) scope was introduced                            through the anus with the intention of advancing to                            the  cecum. The scope was advanced to the ascending                            colon before the procedure was aborted. Medications                            were given. The patient tolerated the procedure                            well. The colonoscopy was technically difficult and                            complex due to a partially obstructing mass. The                            quality of the bowel preparation was good. The                            rectum was photographed. Scope In: 2:55:12 PM Scope Out: 3:08:19 PM Total Procedure Duration: 0 hours 13 minutes 7 seconds  Findings:                 The digital rectal exam was normal.                           A fungating and ulcerated partially obstructing                            large mass was found in the distal ascending colon.                            The mass was circumferential. Oozing was present.                            The pediatric colonoscopy would not tranverse this                            lesion. This was biopsied with a cold forceps for  histology. Area distal to the mass was tattooed                            with injections of total 5 mL of Spot (carbon                            black).                           Multiple small and large-mouthed diverticula were                            found in the sigmoid colon, descending colon and                            transverse colon.                           Internal hemorrhoids were found during                            retroflexion. The hemorrhoids were small. Complications:            No immediate complications. Estimated Blood Loss:     Estimated blood loss was minimal. 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 Biopsied. Tattooed.                           - Moderate diverticulosis in the sigmoid colon, in                            the descending  colon and in the transverse colon.                           - Internal hemorrhoids. Recommendation:           - Patient has a contact number available for                            emergencies. The signs and symptoms of potential                            delayed complications were discussed with the                            patient. Return to normal activities tomorrow.                            Written discharge instructions were provided to the                            patient.                           -  Resume previous diet.                           - Continue present medications.                           - Await pathology results.                           - Perform CT scan (computed tomography) of the                            chest, abdomen and pelvis with contrast at                            appointment to be scheduled.                           - Check CBC, BMET today.                           - Will need surgical and oncology referrals.                           - Repeat colonoscopy is recommended. The                            colonoscopy date will be determined after pathology                            results from today's exam become available for                            review. Jerene Bears, MD 09/16/2017 3:23:45 PM This report has been signed electronically.

## 2017-09-16 NOTE — Progress Notes (Signed)
Called to room to assist during endoscopic procedure.  Patient ID and intended procedure confirmed with present staff. Received instructions for my participation in the procedure from the performing physician.  

## 2017-09-16 NOTE — Progress Notes (Signed)
Report given to PACU, vss 

## 2017-09-16 NOTE — Op Note (Signed)
[facility] Patient Name: Janet Barnes Procedure Date: 09/16/2017 2:33 PM MRN: [id number] Endoscopist: Jerene Bears , MD Age: 84 Referring MD:  Date of Birth: 1934/03/12 Gender: Female Account #: [id number] Procedure:                Upper GI endoscopy Indications:              Upper abdominal pain, Iron deficiency anemia Medicines:                Monitored Anesthesia Care Procedure:                Pre-Anesthesia Assessment:                           - Prior to the procedure, a History and Physical                            was performed, and patient medications and                            allergies were reviewed. The patient's tolerance of                            previous anesthesia was also reviewed. The risks                            and benefits of the procedure and the sedation                            options and risks were discussed with the patient.                            All questions were answered, and informed consent                            was obtained. Prior Anticoagulants: The patient has                            taken no previous anticoagulant or antiplatelet                            agents. ASA Grade Assessment: III - A patient with                            severe systemic disease. After reviewing the risks                            and benefits, the patient was deemed in                            satisfactory condition to undergo the procedure.                           After obtaining informed consent, the endoscope was  passed under direct vision. Throughout the                            procedure, the patient's blood pressure, pulse, and                            oxygen saturations were monitored continuously. The                            Model GIF-HQ190 [id number]) scope was introduced                            through the mouth, and advanced to the third part                            of  duodenum. The upper GI endoscopy was                            accomplished without difficulty. The patient                            tolerated the procedure well. Scope In: Scope Out: Findings:                 The examined esophagus was normal.                           Diffuse mildly erythematous mucosa was found in the                            gastric antrum. Biopsies were taken with a cold                            forceps for histology and Helicobacter pylori                            testing.                           The examined duodenum was normal. Biopsies for                            histology were taken with a cold forceps for                            evaluation of celiac disease. Complications:            No immediate complications. Estimated Blood Loss:     Estimated blood loss was minimal. Impression:               - Normal esophagus.                           - Erythematous mucosa in the antrum. Biopsied.                           -  Normal examined duodenum. Biopsied. Recommendation:           - Patient has a contact number available for                            emergencies. The signs and symptoms of potential                            delayed complications were discussed with the                            patient. Return to normal activities tomorrow.                            Written discharge instructions were provided to the                            patient.                           - Resume previous diet.                           - Continue present medications.                           - Await pathology results.                           - See colonoscopy report. Jerene Bears, MD 09/16/2017 3:15:35 PM This report has been signed electronically.

## 2017-09-17 ENCOUNTER — Telehealth: Payer: Self-pay | Admitting: Hematology

## 2017-09-17 ENCOUNTER — Ambulatory Visit (INDEPENDENT_AMBULATORY_CARE_PROVIDER_SITE_OTHER)
Admission: RE | Admit: 2017-09-17 | Discharge: 2017-09-17 | Disposition: A | Discharge status: Home / Self Care | Payer: Medicare Other | Source: Ambulatory Visit | Attending: Internal Medicine | Admitting: Internal Medicine

## 2017-09-17 ENCOUNTER — Telehealth: Payer: Self-pay

## 2017-09-17 ENCOUNTER — Encounter: Payer: Self-pay | Admitting: Hematology

## 2017-09-17 ENCOUNTER — Ambulatory Visit: Payer: Medicare Other | Admitting: Gastroenterology

## 2017-09-17 ENCOUNTER — Other Ambulatory Visit: Payer: Self-pay

## 2017-09-17 DIAGNOSIS — K639 Disease of intestine, unspecified: Secondary | ICD-10-CM

## 2017-09-17 DIAGNOSIS — K6389 Other specified diseases of intestine: Secondary | ICD-10-CM

## 2017-09-17 DIAGNOSIS — C189 Malignant neoplasm of colon, unspecified: Secondary | ICD-10-CM

## 2017-09-17 MED ORDER — IOPAMIDOL (ISOVUE-300) INJECTION 61%
100.0000 mL | Freq: Once | INTRAVENOUS | Status: AC | PRN
  Administered 2017-09-17: 100 mL via INTRAVENOUS

## 2017-09-17 NOTE — Telephone Encounter (Signed)
Pt's appt has been moved to 1pm per MD's request. Pt agreed to the time change. New letter created and mailed.

## 2017-09-17 NOTE — Telephone Encounter (Signed)
  Follow up Call-  Call back number 09/16/2017  Post procedure Call Back phone  # [phone]  Permission to leave phone message Yes  Some recent data might be hidden     Patient questions:  Do you have a fever, pain , or abdominal swelling? Yes.  patient's daughter answered. Stated patient is still having the same pain that brought her to having a colonoscopy. No rating, patient in shower. Pain Score  0 *  Have you tolerated food without any problems? Yes.    Have you been able to return to your normal activities? Yes.    Do you have any questions about your discharge instructions: Diet   No. Medications  No. Follow up visit  No.  Do you have questions or concerns about your Care? No.  Actions: * If pain score is 4 or above: No action needed, pain <4.

## 2017-09-17 NOTE — Telephone Encounter (Signed)
Appt has been scheduled for the pt to see Dr. Burr Medico on 1/29 at 230pm. Pt and her son are aware to arrive 30 minutes early. Letter mailed and referring office notified.

## 2017-09-18 ENCOUNTER — Other Ambulatory Visit: Payer: Self-pay

## 2017-09-18 DIAGNOSIS — R229 Localized swelling, mass and lump, unspecified: Secondary | ICD-10-CM

## 2017-09-18 DIAGNOSIS — C189 Malignant neoplasm of colon, unspecified: Secondary | ICD-10-CM

## 2017-09-18 MED ORDER — HYDROCODONE-ACETAMINOPHEN 5-325 MG PO TABS: ORAL_TABLET | ORAL | 0 refills | Status: DC

## 2017-09-19 ENCOUNTER — Telehealth: Payer: Self-pay | Admitting: Internal Medicine

## 2017-09-19 NOTE — Telephone Encounter (Signed)
[facility] aware that mass is cancerous. Pt scheduled to see Dr. Dema Severin 09/30/17@11 :15am.

## 2017-09-19 NOTE — Telephone Encounter (Signed)
[facility] calling about referral for patient. Wanting to confirm that rectal mass is cancer. Requesting call back.

## 2017-09-20 ENCOUNTER — Telehealth: Payer: Self-pay | Admitting: Internal Medicine

## 2017-09-20 NOTE — Telephone Encounter (Signed)
Spoke with pts son and let him know that the surgeon appt is to discuss plan of care. Explained the comprehensive approach with Surgeon and oncologist and he verbalized understanding.

## 2017-09-24 ENCOUNTER — Ambulatory Visit: Payer: Medicare Other | Admitting: Hematology

## 2017-09-24 ENCOUNTER — Encounter: Payer: Self-pay | Admitting: Nurse Practitioner

## 2017-09-24 ENCOUNTER — Telehealth: Payer: Self-pay | Admitting: Hematology

## 2017-09-24 ENCOUNTER — Inpatient Hospital Stay: Payer: Medicare Other

## 2017-09-24 ENCOUNTER — Inpatient Hospital Stay: Payer: Medicare Other | Attending: Hematology | Admitting: Nurse Practitioner

## 2017-09-24 VITALS — BP 150/62 | HR 79 | Temp 98.0°F | Resp 17 | Ht 63.0 in | Wt 177.9 lb

## 2017-09-24 DIAGNOSIS — N183 Chronic kidney disease, stage 3 (moderate): Secondary | ICD-10-CM | POA: Insufficient documentation

## 2017-09-24 DIAGNOSIS — Z800 Family history of malignant neoplasm of digestive organs: Secondary | ICD-10-CM | POA: Insufficient documentation

## 2017-09-24 DIAGNOSIS — C182 Malignant neoplasm of ascending colon: Secondary | ICD-10-CM | POA: Insufficient documentation

## 2017-09-24 DIAGNOSIS — Z87891 Personal history of nicotine dependence: Secondary | ICD-10-CM | POA: Insufficient documentation

## 2017-09-24 DIAGNOSIS — R229 Localized swelling, mass and lump, unspecified: Secondary | ICD-10-CM | POA: Diagnosis not present

## 2017-09-24 DIAGNOSIS — D638 Anemia in other chronic diseases classified elsewhere: Secondary | ICD-10-CM | POA: Diagnosis not present

## 2017-09-24 DIAGNOSIS — D509 Iron deficiency anemia, unspecified: Secondary | ICD-10-CM | POA: Diagnosis not present

## 2017-09-24 DIAGNOSIS — F509 Eating disorder, unspecified: Secondary | ICD-10-CM

## 2017-09-24 DIAGNOSIS — Z809 Family history of malignant neoplasm, unspecified: Secondary | ICD-10-CM | POA: Diagnosis not present

## 2017-09-24 DIAGNOSIS — I10 Essential (primary) hypertension: Secondary | ICD-10-CM | POA: Insufficient documentation

## 2017-09-24 LAB — CBC WITH DIFFERENTIAL (CANCER CENTER ONLY)
BASOS PCT: 0 %
Basophils Absolute: 0 10*3/uL (ref 0.0–0.1)
EOS ABS: 0.5 10*3/uL (ref 0.0–0.5)
Eosinophils Relative: 6 %
HCT: 28.8 % — ABNORMAL LOW (ref 34.8–46.6)
Hemoglobin: 8.8 g/dL — ABNORMAL LOW (ref 11.6–15.9)
LYMPHS ABS: 1 10*3/uL (ref 0.9–3.3)
Lymphocytes Relative: 12 %
MCH: 26 pg (ref 25.1–34.0)
MCHC: 30.6 g/dL — ABNORMAL LOW (ref 31.5–36.0)
MCV: 85 fL (ref 79.5–101.0)
MONO ABS: 0.7 10*3/uL (ref 0.1–0.9)
MONOS PCT: 9 %
NEUTROS ABS: 5.9 10*3/uL (ref 1.5–6.5)
Neutrophils Relative %: 73 %
Platelet Count: 458 10*3/uL — ABNORMAL HIGH (ref 145–400)
RBC: 3.39 MIL/uL — ABNORMAL LOW (ref 3.70–5.45)
RDW: 21.7 % — ABNORMAL HIGH (ref 11.2–16.1)
WBC Count: 8 10*3/uL (ref 3.9–10.3)

## 2017-09-24 LAB — CMP (CANCER CENTER ONLY)
ALBUMIN: 3.1 g/dL — AB (ref 3.5–5.0)
ALK PHOS: 42 U/L (ref 40–150)
ALT: 26 U/L (ref 0–55)
AST: 33 U/L (ref 5–34)
Anion gap: 8 (ref 3–11)
BUN: 14 mg/dL (ref 7–26)
CALCIUM: 9 mg/dL (ref 8.4–10.4)
CHLORIDE: 104 mmol/L (ref 98–109)
CO2: 27 mmol/L (ref 22–29)
CREATININE: 1.28 mg/dL — AB (ref 0.60–1.10)
GFR, Est AFR Am: 44 mL/min — ABNORMAL LOW (ref >60)
GFR, Estimated: 38 mL/min — ABNORMAL LOW (ref >60)
Glucose, Bld: 96 mg/dL (ref 70–140)
Potassium: 3.9 mmol/L (ref 3.3–4.7)
SODIUM: 139 mmol/L (ref 136–145)
Total Bilirubin: 0.3 mg/dL (ref 0.2–1.2)
Total Protein: 7 g/dL (ref 6.4–8.3)

## 2017-09-24 NOTE — Telephone Encounter (Signed)
Gave avs for today

## 2017-09-24 NOTE — Progress Notes (Signed)
  Oncology Nurse Navigator Documentation  Navigator Location: [facility] (09/24/17 1416) Referral date to RadOnc/MedOnc: 09/17/17 (09/24/17 1416) )Navigator Encounter Type: Initial MedOnc (09/24/17 1416)   Abnormal Finding Date: 09/16/17 (09/24/17 1416) Confirmed Diagnosis Date: 09/16/17 (09/24/17 1416)                 Treatment Phase: Pre-Tx/Tx Discussion (09/24/17 1416) Barriers/Navigation Needs: No barriers at this time (09/24/17 1416)   Interventions: None required (09/24/17 1416)  Met with patient, son and daughter to introduce myself and my role as GI Navigator.  Patient and family has my contact information and verbalized understanding that they can call with questions or concerns. No navigation barriers identified at this time.          Acuity: Level 1 (09/24/17 1416)         Time Spent with Patient: 15 (09/24/17 1416)

## 2017-09-24 NOTE — Progress Notes (Addendum)
[place]  [phone] Fax:([phone]) [place] Note   Patient Care Team: Willey Blade, MD as PCP - General (Internal Medicine) 09/24/2017  CHIEF COMPLAINTS/PURPOSE OF CONSULTATION:  Adenocarcinoma of ascending colon   REFERRING PROVIDER: Zenovia Jarred, MD    Malignant neoplasm of ascending colon ([place])   09/16/2017 Procedure    Colonoscopy per Dr. Hilarie Fredrickson - The digital rectal exam was normal. - A fungating and ulcerated partially obstructing large mass was found in the distal ascending colon. The mass was circumferential. Oozing was present. The pediatric colonoscopy would not tranverse this lesion. This was biopsied with a cold forceps for histology. Area distal to the mass was tattooed with injections of total 5 mL of Spot (carbon black). - Multiple small and large-mouthed diverticula were found in the sigmoid colon, descending colon and transverse colon. - Internal hemorrhoids were found during retroflexion. The hemorrhoids were small.  IMPRESSION - Malignant partially obstructing tumor in the distal ascending colon. Biopsied. Tattooed. - Moderate diverticulosis in the sigmoid colon, in the descending colon and in the transverse colon. - Internal hemorrhoids.      09/16/2017 Procedure    EGD IMPRESSION - Normal esophagus. - Erythematous mucosa in the antrum. Biopsied. - Normal examined duodenum. Biopsied.        09/17/2017 Imaging    CT CAP IMPRESSION: 1. Circumferential mass of the ascending colon extending over a 5.7 cm length associated with prominent luminal narrowing and surrounding edema as well as surrounding tumor nodularity. Adenopathy in the lower neck, chest, upper abdomen, retroperitoneum, and pelvis with scattered subcutaneous nodules in the chest, abdomen, and pelvis; a suspected intramuscular metastatic lesion along the right gluteus maximus; and a few soft tissue nodules in the omentum. This is an  unusual pattern for metastatic spread of colon adenocarcinoma, and while I cannot be certain that the subcutaneous nodules represent active tumor, the unusual distribution does raise the possibility of melanoma or lymphoma with colon involvement, or some combination of malignancies. Small amount of ascites in the right paracolic gutter and pelvis. 2. Hypodensity along the falciform ligament is probably from focal fatty infiltration. No definite hepatic metastatic lesion is seen. 3. Other imaging findings of potential clinical significance: Aortic Atherosclerosis (ICD10-I70.0). Mild airway plugging in the right lower lobe. Trace right pleural effusion. Descending and sigmoid colon diverticulosis.       09/25/2017 Initial Diagnosis    Malignant neoplasm of ascending colon ([facility])      HISTORY OF PRESENTING ILLNESS:  Janet Barnes 84 y.o. female is here because of newly diagnosed cancer of the ascending colon. She was in her usual state of health until she developed bronchitis late December 2018. She was prescribed cough syrup but only took 2 doses because she noted decreased appetite and fatigue after taking it. Her bronchitis resolved but remained fatigued. She was due for her previously scheduled colonoscopy in January 2019 and developed stabbing left lower abdomina pain after eating with intermittent nausea and diarrhea. Labs performed at GI with significant anemia with low iron studies. Colonoscopy found a fungating and ulcerated partially obstructing large mass in the distal ascending colon which was circumferential. EGD with normal esophagus and duodenum but with erythematous mucosa in the gastric antrum which was biopsied. Staging CT CAP revealed cirfumferential mass in the ascending colon extending over a 5.7 cm length associated with prominent luminal narrowing and surrounding edema as well as surrounding tumor nodularity. Also notable for adenopathy in the lower neck, chest, upper  abdomen, retroperitoneum and pelvis with scattered subcutaneous nodules in the chest, abdomen, and pelvis; there is suspected intramuscular metastatic lesion along the right gluteus maximus.   Past medical history is significant for anemia but has not previously been prescribed oral iron replacement and never received blood transfusion. She has history of GERD but had not been taking PPI lately, she has since restarted pantoprazole. Positive for HTN, kidney disease, hyperlipidemia, macular degeneration, and retinal detachment. She has had hysterectomy but no other abdominal surgeries. Family history is strongly positive for colon cancer in her mother, pancreatic cancer in her sister. She has two children who are alive and healthy. She continues to be active in her community with church work and goes to the gym 4 times per week; she drives her self, lives alone, and is independent of all ADLs. Her children live out of town but are here for consult today. Daughter has ample time off with work if she should need more care during course of treatment.   Today she feels well overall. Denies abdominal pain at this time as it occurs mostly at night when she lies down. Does not want to take norco due to hydrocodone component. Reports 3 recent episodes of blood in stool without frank red blood per rectum. She received 2 doses of IV feraheme on 08/29/17 and 09/06/17, tolerated the first infusion but developed dyspnea, itching, nausea, and multiple subcutaneous skin nodules later in the day after second Feraheme infusion.  Denies vomiting, dysphagia, or weight loss. She is most concerned about multiple nontender skin nodules that appeared the day of her 2nd Feraheme infusion, one on her chin appears to be enlarging.  MEDICAL HISTORY:  Past Medical History:  Diagnosis Date  . Allergy    seasonal  . Anemia   . Blood transfusion without reported diagnosis    IV Feraheme  . Chronic kidney disease   . GERD  (gastroesophageal reflux disease)   . Hyperlipidemia   . Hypertension   . Macular degeneration   . Retinal break of left eye 2013    SURGICAL HISTORY: Past Surgical History:  Procedure Laterality Date  . ABDOMINAL HYSTERECTOMY  1977    SOCIAL HISTORY: Social History   Socioeconomic History  . Marital status: Widowed    Spouse name: Not on file  . Number of children: Not on file  . Years of education: Not on file  . Highest education level: Not on file  Social Needs  . Financial resource strain: Not on file  . Food insecurity - worry: Not on file  . Food insecurity - inability: Not on file  . Transportation needs - medical: Not on file  . Transportation needs - non-medical: Not on file  Occupational History    Comment: active with church work   Tobacco Use  . Smoking status: Former Research scientist (life sciences)  . Smokeless tobacco: Never Used  . Tobacco comment: no consistent smoking history  Substance and Sexual Activity  . Alcohol use: No  . Drug use: No  . Sexual activity: Not on file  Other Topics Concern  . Not on file  Social History Narrative  . Not on file    FAMILY HISTORY: Family History  Problem Relation Age of Onset  . Colon cancer Mother 9       colon   . Heart disease Father   . Cancer Sister   . Pancreatic cancer Sister 55       pancreatic   . Rectal cancer Neg Hx   .  Stomach cancer Neg Hx   . Liver cancer Neg Hx     ALLERGIES:  is allergic to feraheme [ferumoxytol]; codeine; and sulfur.  MEDICATIONS:  Current Outpatient Medications  Medication Sig Dispense Refill  . BYSTOLIC 10 MG tablet Take 1 tablet by mouth Daily.    . Cholecalciferol (VITAMIN D) 2000 UNITS tablet Take 2,000 Units by mouth daily.    . Fenofibrate 40 MG TABS Take by mouth daily.    . Magnesium Hydroxide (MAGNESIA PO) Take 10 mg by mouth daily.    . pantoprazole (PROTONIX) 40 MG tablet Take 1 tablet (40 mg total) by mouth daily. 30 tablet 1  . spironolactone (ALDACTONE) 25 MG tablet Take  12.5 mg by mouth Daily.    . Coenzyme Q10 (CO Q 10) 60 MG CAPS Take 1 capsule by mouth daily.    Marland Kitchen GAVILYTE-G 236 g solution     . HYDROcodone-acetaminophen (NORCO/VICODIN) 5-325 MG tablet Take 1 tablet by mouth every 6-8 hours as needed for pain 60 tablet 0   No current facility-administered medications for this visit.     REVIEW OF SYSTEMS:   Constitutional: Denies fevers, chills or abnormal night sweats (+) mild weight loss (+) decreased appetite (+) mild fatigue Eyes: Denies blurriness of vision, double vision or watery eyes Ears, nose, mouth, throat, and face: Denies mucositis or sore throat Respiratory: Denies cough, dyspnea or wheezes Cardiovascular: Denies palpitation, chest discomfort or lower extremity swelling Gastrointestinal:  Denies vomiting, constipation, dysphagia, or hematochezia (+) GERD (+) left lower abdominal pain, stabbing, occurs on lying down (+) intermittent diarrhea at diagnosis, improved  Skin: Denies abnormal skin rashes (+) multiple but nontender skin nodules on neck, chin, chest, arms, under arm, right breast, and back Lymphatics: Denies new lymphadenopathy or easy bruising Neurological:Denies numbness, tingling or new weaknesses Behavioral/Psych: Mood is stable, no new changes  All other systems were reviewed with the patient and are negative.  PHYSICAL EXAMINATION: ECOG PERFORMANCE STATUS: 1 - Symptomatic but completely ambulatory  Vitals:   09/24/17 1309  BP: (!) 150/62  Pulse: 79  Resp: 17  Temp: 98 F (36.7 C)  SpO2: 100%   Filed Weights   09/24/17 1309  Weight: 177 lb 14.4 oz (80.7 kg)    GENERAL:alert, no distress and comfortable SKIN: skin color, texture, turgor are normal, no rashes (+) multiple firm small round superficial subcutaneous nodules to cervical, submandibular, supraclavicular, axillary, upper extremity, upper back, and below right breast areas; some are deeper than others  EYES: normal, conjunctiva are pink and non-injected,  sclera clear OROPHARYNX:no exudate, no erythema and lips, buccal mucosa, and tongue normal  NECK: supple, thyroid normal size, non-tender, without nodularity LYMPH:  (+) palpable cervical, supraclavicular, and axillary lymphadenopathy LUNGS: clear to auscultation bilaterally with normal breathing effort HEART: regular rate & rhythm and no murmurs and no lower extremity edema ABDOMEN:abdomen soft, non-tender and normal bowel sounds. No hepatomegaly  Musculoskeletal:no cyanosis of digits and no clubbing  PSYCH: alert & oriented x 3 with fluent speech NEURO: no focal motor/sensory deficits  LABORATORY DATA:  I have reviewed the data as listed CBC Latest Ref Rng & Units 09/16/2017 09/10/2017 08/28/2017  WBC 4.0 - 10.5 K/uL 9.2 8.9 9.4  Hemoglobin 12.0 - 15.0 g/dL 8.8 Repeated and verified X2.(L) 7.7 Repeated and verified X2.(LL) 8.5 Repeated and verified X2.(L)  Hematocrit 36.0 - 46.0 % 27.6(L) 24.7(L) 26.6 Repeated and verified X2.(L)  Platelets 150.0 - 400.0 K/uL 451.0(H) 545.0(H) 506.0(H)   CMP Latest Ref Rng & Units  09/24/2017 09/16/2017 08/28/2017  Glucose 70 - 140 mg/dL 96 114(H) 110(H)  BUN 7 - 26 mg/dL 14 12 18   Creatinine 0.40 - 1.20 mg/dL - 1.21(H) 1.42(H)  Sodium 136 - 145 mmol/L 139 135 136  Potassium 3.3 - 4.7 mmol/L 3.9 3.9 3.9  Chloride 98 - 109 mmol/L 104 102 103  CO2 22 - 29 mmol/L 27 25 26   Calcium 8.4 - 10.4 mg/dL 9.0 8.6 8.7  Total Protein 6.4 - 8.3 g/dL 7.0 - 7.6  Total Bilirubin 0.2 - 1.2 mg/dL 0.3 - 0.3  Alkaline Phos 40 - 150 U/L 42 - 50  AST 5 - 34 U/L 33 - 22  ALT 0 - 55 U/L 26 - 20   PATHOLOGY REPORT    RADIOGRAPHIC STUDIES: I have personally reviewed the radiological images as listed and agreed with the findings in the report. Ct Chest W Contrast  Result Date: 09/17/2017 CLINICAL DATA:  Ascending colon mass on colonoscopy. EXAM: CT CHEST, ABDOMEN, AND PELVIS WITH CONTRAST TECHNIQUE: Multidetector CT imaging of the chest, abdomen and pelvis was performed  following the standard protocol during bolus administration of intravenous contrast. CONTRAST:  140mL ISOVUE-300 IOPAMIDOL (ISOVUE-300) INJECTION 61% COMPARISON:  None. FINDINGS: CT CHEST FINDINGS Cardiovascular: Aortic arch and branch vessel atherosclerotic vascular disease. Mediastinum/Nodes: Abnormal left supraclavicular and mediastinal lymph nodes including a 1.0 cm left supraclavicular node on image 6/2 and a 1.2 cm AP window lymph node on image 22/2, as well as a 1.0 cm upper mediastinal lymph node on image 10/2 and a 1.0 cm prevascular lymph node on image 21/2. Other notable lymph nodes include a 0.7 cm right submandibular lymph node and a 0.9 cm left axillary lymph node. Lungs/Pleura: Mild airway plugging in the right lower lobe medially. Trace right pleural effusion. Musculoskeletal: Thoracic spondylosis. There are scattered subcutaneous nodules along the chest of uncertain significance. An index nodule along the right posterior back measures 0.7 cm in short axis on image 26/2. CT ABDOMEN PELVIS FINDINGS Hepatobiliary: Hypodensity in segment 3 along the falciform ligament is probably from focal fatty infiltration. Otherwise the liver appears unremarkable. Mildly contracted gallbladder. No biliary dilatation. Pancreas: Unremarkable Spleen: Unremarkable Adrenals/Urinary Tract: Adrenal glands unremarkable. Bilateral renal peripelvic cysts. Stomach/Bowel: A circumferential mass of the ascending colon extends over a 5.7 cm length and is associated with prominent luminal narrowing there is some mild wall thickening in the adjacent cecum. Appendix within normal limits. Descending and sigmoid colon diverticulosis without evidence of active diverticulitis. Vascular/Lymphatic: Aortoiliac atherosclerotic vascular disease. There is right gastric, porta hepatis, celiac, peripancreatic, periaortic, central mesenteric, bilateral common iliac, bilateral external iliac, and mild inguinal adenopathy. Index portacaval lymph  node 2.0 cm in short axis, image 60/2. Index left periaortic lymph node 1.3 cm in short axis, image 65/2. Index right common iliac lymph node 1.2 cm in short axis on image 81/2. Index right inguinal lymph node 1.1 cm in short axis, image 109/2. Adjacent to the right colon mass, there is surrounding fluid/edema as well as soft tissue nodularity probably from local nodal metastatic disease, including a 1.0 cm lymph node on image 62/6. Reproductive: Uterus absent.  Adnexa unremarkable. Other: Scattered subcutaneous nodules, index lesion along the right anterior upper abdomen 1.1 by 1.3 cm on image 51/2. Numerous other subcutaneous nodules are present. There are also small nodules in the omentum such as the 1.4 by 0.5 cm nodule anterior to the lateral segment left hepatic lobe on image 62/2. Nodularity along the right paracolic gutter ascites. Small amount of  pelvic ascites. Musculoskeletal: We partially image a soft tissue nodule in the right gluteus maximus muscle measuring 1.3 by 1.5 cm on image 118/2. As noted above there scattered subcutaneous nodules concerning for tumor foci. Lumbar spondylosis with bridging spurring. No compelling findings of osseous metastatic disease. IMPRESSION: 1. Circumferential mass of the ascending colon extending over a 5.7 cm length associated with prominent luminal narrowing and surrounding edema as well as surrounding tumor nodularity. Adenopathy in the lower neck, chest, upper abdomen, retroperitoneum, and pelvis with scattered subcutaneous nodules in the chest, abdomen, and pelvis; a suspected intramuscular metastatic lesion along the right gluteus maximus; and a few soft tissue nodules in the omentum. This is an unusual pattern for metastatic spread of colon adenocarcinoma, and while I cannot be certain that the subcutaneous nodules represent active tumor, the unusual distribution does raise the possibility of melanoma or lymphoma with colon involvement, or some combination of  malignancies. Small amount of ascites in the right paracolic gutter and pelvis. 2. Hypodensity along the falciform ligament is probably from focal fatty infiltration. No definite hepatic metastatic lesion is seen. 3. Other imaging findings of potential clinical significance: Aortic Atherosclerosis (ICD10-I70.0). Mild airway plugging in the right lower lobe. Trace right pleural effusion. Descending and sigmoid colon diverticulosis. Electronically Signed   By: Van Clines M.D.   On: 09/17/2017 14:10   Ct Abdomen Pelvis W Contrast  Result Date: 09/17/2017 CLINICAL DATA:  Ascending colon mass on colonoscopy. EXAM: CT CHEST, ABDOMEN, AND PELVIS WITH CONTRAST TECHNIQUE: Multidetector CT imaging of the chest, abdomen and pelvis was performed following the standard protocol during bolus administration of intravenous contrast. CONTRAST:  191mL ISOVUE-300 IOPAMIDOL (ISOVUE-300) INJECTION 61% COMPARISON:  None. FINDINGS: CT CHEST FINDINGS Cardiovascular: Aortic arch and branch vessel atherosclerotic vascular disease. Mediastinum/Nodes: Abnormal left supraclavicular and mediastinal lymph nodes including a 1.0 cm left supraclavicular node on image 6/2 and a 1.2 cm AP window lymph node on image 22/2, as well as a 1.0 cm upper mediastinal lymph node on image 10/2 and a 1.0 cm prevascular lymph node on image 21/2. Other notable lymph nodes include a 0.7 cm right submandibular lymph node and a 0.9 cm left axillary lymph node. Lungs/Pleura: Mild airway plugging in the right lower lobe medially. Trace right pleural effusion. Musculoskeletal: Thoracic spondylosis. There are scattered subcutaneous nodules along the chest of uncertain significance. An index nodule along the right posterior back measures 0.7 cm in short axis on image 26/2. CT ABDOMEN PELVIS FINDINGS Hepatobiliary: Hypodensity in segment 3 along the falciform ligament is probably from focal fatty infiltration. Otherwise the liver appears unremarkable. Mildly  contracted gallbladder. No biliary dilatation. Pancreas: Unremarkable Spleen: Unremarkable Adrenals/Urinary Tract: Adrenal glands unremarkable. Bilateral renal peripelvic cysts. Stomach/Bowel: A circumferential mass of the ascending colon extends over a 5.7 cm length and is associated with prominent luminal narrowing there is some mild wall thickening in the adjacent cecum. Appendix within normal limits. Descending and sigmoid colon diverticulosis without evidence of active diverticulitis. Vascular/Lymphatic: Aortoiliac atherosclerotic vascular disease. There is right gastric, porta hepatis, celiac, peripancreatic, periaortic, central mesenteric, bilateral common iliac, bilateral external iliac, and mild inguinal adenopathy. Index portacaval lymph node 2.0 cm in short axis, image 60/2. Index left periaortic lymph node 1.3 cm in short axis, image 65/2. Index right common iliac lymph node 1.2 cm in short axis on image 81/2. Index right inguinal lymph node 1.1 cm in short axis, image 109/2. Adjacent to the right colon mass, there is surrounding fluid/edema as well as soft tissue  nodularity probably from local nodal metastatic disease, including a 1.0 cm lymph node on image 62/6. Reproductive: Uterus absent.  Adnexa unremarkable. Other: Scattered subcutaneous nodules, index lesion along the right anterior upper abdomen 1.1 by 1.3 cm on image 51/2. Numerous other subcutaneous nodules are present. There are also small nodules in the omentum such as the 1.4 by 0.5 cm nodule anterior to the lateral segment left hepatic lobe on image 62/2. Nodularity along the right paracolic gutter ascites. Small amount of pelvic ascites. Musculoskeletal: We partially image a soft tissue nodule in the right gluteus maximus muscle measuring 1.3 by 1.5 cm on image 118/2. As noted above there scattered subcutaneous nodules concerning for tumor foci. Lumbar spondylosis with bridging spurring. No compelling findings of osseous metastatic  disease. IMPRESSION: 1. Circumferential mass of the ascending colon extending over a 5.7 cm length associated with prominent luminal narrowing and surrounding edema as well as surrounding tumor nodularity. Adenopathy in the lower neck, chest, upper abdomen, retroperitoneum, and pelvis with scattered subcutaneous nodules in the chest, abdomen, and pelvis; a suspected intramuscular metastatic lesion along the right gluteus maximus; and a few soft tissue nodules in the omentum. This is an unusual pattern for metastatic spread of colon adenocarcinoma, and while I cannot be certain that the subcutaneous nodules represent active tumor, the unusual distribution does raise the possibility of melanoma or lymphoma with colon involvement, or some combination of malignancies. Small amount of ascites in the right paracolic gutter and pelvis. 2. Hypodensity along the falciform ligament is probably from focal fatty infiltration. No definite hepatic metastatic lesion is seen. 3. Other imaging findings of potential clinical significance: Aortic Atherosclerosis (ICD10-I70.0). Mild airway plugging in the right lower lobe. Trace right pleural effusion. Descending and sigmoid colon diverticulosis. Electronically Signed   By: Van Clines M.D.   On: 09/17/2017 14:10   Colonoscopy  09/16/2017 Dr. Hilarie Fredrickson  - Malignant partially obstructing tumor in the distal ascending colon. Biopsied. Tattooed. - Moderate diverticulosis in the sigmoid colon, in the descending colon and in the transverse colon. - Internal hemorrhoids.  EGD 09/16/2017 Normal esophagus. - Erythematous mucosa in the antrum. Biopsied. - Normal examined duodenum. Biopsied.   ASSESSMENT & PLAN: Janet Barnes is a pleasant 84 year old female with history of iron deficiency anemia and GERD now with colon mass.   1. Adenocarcinoma of ascending colon, [id number] -We reviewed her records, imaging, and pathology in detail.  -She presents with multiple  subcutaneous skin nodules and adenopathy on CT; no evidence of metastasis in the liver -We discussed this is an unlikely pattern of metastasis for her cancer type and location, but possible.  -She is scheduled for biopsy of superficial nodule on 1/31; if biopsy is inconclusive, will likely obtain PET scan and possibly biopsy deeper adenopathy for diagnosis. -We discussed if biopsy of lesions confirms metastatic adenocarcinoma, her disease will likely not be curable at that point but treatable, she is elderly but in good physical condition and would likely be a good candidate for systemic chemotherapy.  -If she remains early stage, she will proceed with surgery; will meet with Dr. Dema Severin 2/4 next week; we will see her approximately 2 week post op. If she is found to have advanced stage disease, she may still require surgery at some point to control symptoms like tumor bleeding.  -F/u pending biopsy on 1/31  2. Diffuse adenopathy and subcutaneous nodules -She developed multiple nodules later in the day of 2nd Feraheme infusion; one lesion in submandibular area appears to  be enlarging -We discussed this is an unlikely pattern for metastasis for adenocarcinoma, but possible.  -Scheduled for biopsy 1/31, will follow  3. Anemia, Iron deficiency, and anemia of chronic disease  -She reports a history of anemia, I do not have distant records. -Recent CBC at GI office with Hgb 8.5, normal MCV; Iron 18, saturation 4%; normal but low ferritin  -S/p 2 infusions IV Feraheme 1/3 and 1/11; she had acute shortness of breath, itching, nausea with second infusion -Ferritin today elevated to 505, iron studies remain low but improving, Hgb 8.8; will monitor closely  -Her anemia has not improved significantly after IV iron, indicating component of anemia of chronic disease, likely secondary to CKD and underline malignancy    4. HTN, HL, chronic Kidney disease stage III, GERD -Continue medication regimen for chronic  conditions -f/u with PCP regularly    PLAN: -she is scheduled for IR biopsy on 09/26/2017. If biopsy of the subcutaneous nodule or adenopathy shows metastatic colon cancer, I will likely recommend systemic chemotherapy first.  If not, I will probably obtain a PET scan for further evaluation -She is scheduled to see colorectal surgeon Dr. Dema Severin next week -We will present her case in our GI tumor board -Her follow-up will be scheduled depends on her treatment plan.   All questions were answered. The patient knows to call the clinic with any problems, questions or concerns. I spent 45 minutes counseling the patient face to face. The total time spent in the appointment was 60 minutes and more than 50% was on counseling.     Alla Feeling, NP 09/24/2017 1:51 PM  Addendum  I have seen the patient, examined her. I agree with the assessment and and plan and have edited the notes.   Mrs Bader is a 84 year old female, with past medical history of hypertension, CKD, otherwise healthy and active, presented with fatigue and vague abdominal pain for a month.  Her lab workup showed significant iron deficient anemia.  She was seen by GI Dr. Hilarie Fredrickson, endoscopy revealed a large mass in the ascending colon, biopsy confirmed adenocarcinoma.  She received IV iron Feraheme twice, and developed diffuse subcutaneous nodule after second infusion. Her staging CT scan showed diffuse lymphadenopathy and subcutaneous nodule which are suspicious but unusual pattern for, metastatic colon cancer she is scheduled for IR biopsy later this week.  Depends on her biopsy results, I may get a PET scan if biopsy negative, for further evaluation.  She is also scheduled to see colorectal surgeon Dr. Dema Severin next week.  We will review her case in our tumor board after above workup, to determine her treatment course.  We discussed her diagnosis, staging workup, and treatment options for localized, versus metastatic colon cancer with  patient, her daughter and son in great details.  They had many questions, and I answered all to their satisfaction.   Truitt Merle  09/24/2017

## 2017-09-25 ENCOUNTER — Telehealth: Payer: Self-pay

## 2017-09-25 ENCOUNTER — Other Ambulatory Visit: Payer: Self-pay | Admitting: Radiology

## 2017-09-25 DIAGNOSIS — C182 Malignant neoplasm of ascending colon: Secondary | ICD-10-CM | POA: Insufficient documentation

## 2017-09-25 LAB — IRON AND TIBC
Iron: 23 ug/dL — ABNORMAL LOW (ref 41–142)
Saturation Ratios: 10 % — ABNORMAL LOW (ref 21–57)
TIBC: 234 ug/dL — ABNORMAL LOW (ref 236–444)
UIBC: 211 ug/dL

## 2017-09-25 LAB — CEA (IN HOUSE-CHCC): CEA (CHCC-In House): 6.41 ng/mL — ABNORMAL HIGH (ref 0.00–5.00)

## 2017-09-25 LAB — FERRITIN: FERRITIN: 505 ng/mL — AB (ref 9–269)

## 2017-09-25 NOTE — Telephone Encounter (Signed)
Pt scheduled to see Dr. Dema Severin with [facility] 09/30/17@11 :15am, pt to arrive at 10:45am.

## 2017-09-26 ENCOUNTER — Ambulatory Visit (HOSPITAL_COMMUNITY)
Admission: RE | Admit: 2017-09-26 | Discharge: 2017-09-26 | Disposition: A | Discharge status: Home / Self Care | Payer: Medicare Other | Source: Ambulatory Visit | Attending: Internal Medicine | Admitting: Internal Medicine

## 2017-09-26 ENCOUNTER — Encounter: Payer: Self-pay | Admitting: Nurse Practitioner

## 2017-09-26 ENCOUNTER — Encounter (HOSPITAL_COMMUNITY): Payer: Self-pay

## 2017-09-26 DIAGNOSIS — C7989 Secondary malignant neoplasm of other specified sites: Secondary | ICD-10-CM | POA: Insufficient documentation

## 2017-09-26 DIAGNOSIS — C189 Malignant neoplasm of colon, unspecified: Secondary | ICD-10-CM | POA: Diagnosis present

## 2017-09-26 DIAGNOSIS — I129 Hypertensive chronic kidney disease with stage 1 through stage 4 chronic kidney disease, or unspecified chronic kidney disease: Secondary | ICD-10-CM | POA: Insufficient documentation

## 2017-09-26 DIAGNOSIS — E785 Hyperlipidemia, unspecified: Secondary | ICD-10-CM | POA: Insufficient documentation

## 2017-09-26 DIAGNOSIS — N189 Chronic kidney disease, unspecified: Secondary | ICD-10-CM | POA: Diagnosis not present

## 2017-09-26 DIAGNOSIS — R229 Localized swelling, mass and lump, unspecified: Secondary | ICD-10-CM | POA: Diagnosis present

## 2017-09-26 DIAGNOSIS — H3530 Unspecified macular degeneration: Secondary | ICD-10-CM | POA: Insufficient documentation

## 2017-09-26 DIAGNOSIS — Z87891 Personal history of nicotine dependence: Secondary | ICD-10-CM | POA: Diagnosis not present

## 2017-09-26 DIAGNOSIS — K219 Gastro-esophageal reflux disease without esophagitis: Secondary | ICD-10-CM | POA: Diagnosis not present

## 2017-09-26 DIAGNOSIS — R222 Localized swelling, mass and lump, trunk: Secondary | ICD-10-CM | POA: Diagnosis present

## 2017-09-26 DIAGNOSIS — Z79899 Other long term (current) drug therapy: Secondary | ICD-10-CM | POA: Insufficient documentation

## 2017-09-26 DIAGNOSIS — R590 Localized enlarged lymph nodes: Secondary | ICD-10-CM | POA: Diagnosis not present

## 2017-09-26 DIAGNOSIS — Z885 Allergy status to narcotic agent status: Secondary | ICD-10-CM | POA: Insufficient documentation

## 2017-09-26 LAB — CBC
HCT: 29.9 % — ABNORMAL LOW (ref 36.0–46.0)
Hemoglobin: 9.4 g/dL — ABNORMAL LOW (ref 12.0–15.0)
MCH: 26.2 pg (ref 26.0–34.0)
MCHC: 31.4 g/dL (ref 30.0–36.0)
MCV: 83.3 fL (ref 78.0–100.0)
Platelets: 461 K/uL — ABNORMAL HIGH (ref 150–400)
RBC: 3.59 MIL/uL — ABNORMAL LOW (ref 3.87–5.11)
RDW: 21.8 % — ABNORMAL HIGH (ref 11.5–15.5)
WBC: 9.1 K/uL (ref 4.0–10.5)

## 2017-09-26 LAB — PROTIME-INR
INR: 1.12
PROTHROMBIN TIME: 14.3 s (ref 11.4–15.2)

## 2017-09-26 LAB — APTT: APTT: 29 s (ref 24–36)

## 2017-09-26 MED ORDER — MIDAZOLAM HCL 2 MG/2ML IJ SOLN
INTRAMUSCULAR | Status: AC
  Filled 2017-09-26: qty 4

## 2017-09-26 MED ORDER — FENTANYL CITRATE (PF) 100 MCG/2ML IJ SOLN
INTRAMUSCULAR | Status: AC | PRN
  Administered 2017-09-26 (×2): 25 ug via INTRAVENOUS

## 2017-09-26 MED ORDER — LIDOCAINE HCL 1 % IJ SOLN
INTRAMUSCULAR | Status: AC
  Filled 2017-09-26: qty 10

## 2017-09-26 MED ORDER — MIDAZOLAM HCL 2 MG/2ML IJ SOLN
INTRAMUSCULAR | Status: AC | PRN
Start: 2017-09-26 — End: 2017-09-26
  Administered 2017-09-26: 0.5 mg via INTRAVENOUS
  Administered 2017-09-26: 1 mg via INTRAVENOUS
  Administered 2017-09-26: 0.5 mg via INTRAVENOUS

## 2017-09-26 MED ORDER — FENTANYL CITRATE (PF) 100 MCG/2ML IJ SOLN
INTRAMUSCULAR | Status: AC
  Filled 2017-09-26: qty 4

## 2017-09-26 MED ORDER — SODIUM CHLORIDE 0.9 % IV SOLN
INTRAVENOUS | Status: DC
  Administered 2017-09-26: 11:00:00 via INTRAVENOUS

## 2017-09-26 NOTE — Procedures (Signed)
Colon ca, subcutaneous nodules rt chest  S/p [place] bx rt chest subcu nodule  No comp Stable ebl 0 Path pending Full report in pacs

## 2017-09-26 NOTE — Sedation Documentation (Signed)
Pt reports pain. Medications given.

## 2017-09-26 NOTE — Consult Note (Signed)
Chief Complaint: Patient was seen in consultation today for US guided biopsy of subcutaneous nodule under right breast vs neck lymph node biopsy  Referring Physician(s): Pyrtle,Jay M  Supervising Physician: Daryll Brod  Patient Status: [facility] - Out-pt  History of Present Illness: Janet Barnes is a 84 y.o. female with history of recently diagnosed colon cancer and imaging findings revealing adenopathy in the lower neck, chest, upper abdomen, retroperitoneum ,pelvis along with scattered subcutaneous nodules in the chest, abdomen and pelvis, right gluteal region as well as omentum.  She presents today for ultrasound-guided subcutaneous nodule versus lymph node biopsy for further evaluation.  Past Medical History:  Diagnosis Date  . Allergy    seasonal  . Anemia   . Blood transfusion without reported diagnosis    IV Feraheme  . Chronic kidney disease   . GERD (gastroesophageal reflux disease)   . Hyperlipidemia   . Hypertension   . Macular degeneration   . Retinal break of left eye 2013    Past Surgical History:  Procedure Laterality Date  . ABDOMINAL HYSTERECTOMY  1977    Allergies: Feraheme [ferumoxytol]; Codeine; and Sulfur  Medications: Prior to Admission medications   Medication Sig Start Date End Date Taking? Authorizing Provider  BYSTOLIC 10 MG tablet Take 1 tablet by mouth Daily. 03/17/12  Yes [provider]  Cholecalciferol (VITAMIN D) 2000 UNITS tablet Take 2,000 Units by mouth daily.   Yes [provider]  Coenzyme Q10 (CO Q 10) 60 MG CAPS Take 1 capsule by mouth daily.   Yes [provider]  Fenofibrate 40 MG TABS Take by mouth daily.   Yes [provider]  Magnesium Hydroxide (MAGNESIA PO) Take 10 mg by mouth daily.   Yes [provider]  pantoprazole (PROTONIX) 40 MG tablet Take 1 tablet (40 mg total) by mouth daily. 08/28/17  Yes Levin Erp, PA  spironolactone (ALDACTONE) 25 MG tablet Take 12.5  mg by mouth Daily. 02/21/12  Yes [provider]  GAVILYTE-G 236 g solution  09/10/17   [provider]  HYDROcodone-acetaminophen (NORCO/VICODIN) 5-325 MG tablet Take 1 tablet by mouth every 6-8 hours as needed for pain 09/18/17   Pyrtle, Lajuan Lines, MD     Family History  Problem Relation Age of Onset  . Colon cancer Mother 90       colon   . Heart disease Father   . Cancer Sister   . Pancreatic cancer Sister 56       pancreatic   . Rectal cancer Neg Hx   . Stomach cancer Neg Hx   . Liver cancer Neg Hx     Social History   Socioeconomic History  . Marital status: Widowed    Spouse name: None  . Number of children: None  . Years of education: None  . Highest education level: None  Social Needs  . Financial resource strain: None  . Food insecurity - worry: None  . Food insecurity - inability: None  . Transportation needs - medical: None  . Transportation needs - non-medical: None  Occupational History    Comment: active with church work   Tobacco Use  . Smoking status: Former Research scientist (life sciences)  . Smokeless tobacco: Never Used  . Tobacco comment: no consistent smoking history  Substance and Sexual Activity  . Alcohol use: No  . Drug use: No  . Sexual activity: None  Other Topics Concern  . None  Social History Narrative  . None  Review of Systems currently denies fever, headache, chest pain, dyspnea, back pain, nausea, vomiting or bleeding.  She does have intermittent abdominal discomfort.  Vital Signs: Blood pressure 139/75, heart rate 66, respirations 16, temp 98.3   Physical Exam awake, alert.  Chest clear to auscultation bilaterally.  Heart with regular rate and rhythm.  Abdomen soft, positive bowel sounds, currently nontender, palpable subcutaneous nodule under right breast noted; cervical/supraclavicular adenopathy present.  Imaging: Ct Chest W Contrast  Result Date: 09/17/2017 CLINICAL DATA:  Ascending colon mass on colonoscopy. EXAM: CT CHEST,  ABDOMEN, AND PELVIS WITH CONTRAST TECHNIQUE: Multidetector CT imaging of the chest, abdomen and pelvis was performed following the standard protocol during bolus administration of intravenous contrast. CONTRAST:  148mL ISOVUE-300 IOPAMIDOL (ISOVUE-300) INJECTION 61% COMPARISON:  None. FINDINGS: CT CHEST FINDINGS Cardiovascular: Aortic arch and branch vessel atherosclerotic vascular disease. Mediastinum/Nodes: Abnormal left supraclavicular and mediastinal lymph nodes including a 1.0 cm left supraclavicular node on image 6/2 and a 1.2 cm AP window lymph node on image 22/2, as well as a 1.0 cm upper mediastinal lymph node on image 10/2 and a 1.0 cm prevascular lymph node on image 21/2. Other notable lymph nodes include a 0.7 cm right submandibular lymph node and a 0.9 cm left axillary lymph node. Lungs/Pleura: Mild airway plugging in the right lower lobe medially. Trace right pleural effusion. Musculoskeletal: Thoracic spondylosis. There are scattered subcutaneous nodules along the chest of uncertain significance. An index nodule along the right posterior back measures 0.7 cm in short axis on image 26/2. CT ABDOMEN PELVIS FINDINGS Hepatobiliary: Hypodensity in segment 3 along the falciform ligament is probably from focal fatty infiltration. Otherwise the liver appears unremarkable. Mildly contracted gallbladder. No biliary dilatation. Pancreas: Unremarkable Spleen: Unremarkable Adrenals/Urinary Tract: Adrenal glands unremarkable. Bilateral renal peripelvic cysts. Stomach/Bowel: A circumferential mass of the ascending colon extends over a 5.7 cm length and is associated with prominent luminal narrowing there is some mild wall thickening in the adjacent cecum. Appendix within normal limits. Descending and sigmoid colon diverticulosis without evidence of active diverticulitis. Vascular/Lymphatic: Aortoiliac atherosclerotic vascular disease. There is right gastric, porta hepatis, celiac, peripancreatic, periaortic, central  mesenteric, bilateral common iliac, bilateral external iliac, and mild inguinal adenopathy. Index portacaval lymph node 2.0 cm in short axis, image 60/2. Index left periaortic lymph node 1.3 cm in short axis, image 65/2. Index right common iliac lymph node 1.2 cm in short axis on image 81/2. Index right inguinal lymph node 1.1 cm in short axis, image 109/2. Adjacent to the right colon mass, there is surrounding fluid/edema as well as soft tissue nodularity probably from local nodal metastatic disease, including a 1.0 cm lymph node on image 62/6. Reproductive: Uterus absent.  Adnexa unremarkable. Other: Scattered subcutaneous nodules, index lesion along the right anterior upper abdomen 1.1 by 1.3 cm on image 51/2. Numerous other subcutaneous nodules are present. There are also small nodules in the omentum such as the 1.4 by 0.5 cm nodule anterior to the lateral segment left hepatic lobe on image 62/2. Nodularity along the right paracolic gutter ascites. Small amount of pelvic ascites. Musculoskeletal: We partially image a soft tissue nodule in the right gluteus maximus muscle measuring 1.3 by 1.5 cm on image 118/2. As noted above there scattered subcutaneous nodules concerning for tumor foci. Lumbar spondylosis with bridging spurring. No compelling findings of osseous metastatic disease. IMPRESSION: 1. Circumferential mass of the ascending colon extending over a 5.7 cm length associated with prominent luminal narrowing and surrounding edema as well as surrounding tumor  nodularity. Adenopathy in the lower neck, chest, upper abdomen, retroperitoneum, and pelvis with scattered subcutaneous nodules in the chest, abdomen, and pelvis; a suspected intramuscular metastatic lesion along the right gluteus maximus; and a few soft tissue nodules in the omentum. This is an unusual pattern for metastatic spread of colon adenocarcinoma, and while I cannot be certain that the subcutaneous nodules represent active tumor, the unusual  distribution does raise the possibility of melanoma or lymphoma with colon involvement, or some combination of malignancies. Small amount of ascites in the right paracolic gutter and pelvis. 2. Hypodensity along the falciform ligament is probably from focal fatty infiltration. No definite hepatic metastatic lesion is seen. 3. Other imaging findings of potential clinical significance: Aortic Atherosclerosis (ICD10-I70.0). Mild airway plugging in the right lower lobe. Trace right pleural effusion. Descending and sigmoid colon diverticulosis. Electronically Signed   By: Van Clines M.D.   On: 09/17/2017 14:10   Ct Abdomen Pelvis W Contrast  Result Date: 09/17/2017 CLINICAL DATA:  Ascending colon mass on colonoscopy. EXAM: CT CHEST, ABDOMEN, AND PELVIS WITH CONTRAST TECHNIQUE: Multidetector CT imaging of the chest, abdomen and pelvis was performed following the standard protocol during bolus administration of intravenous contrast. CONTRAST:  154mL ISOVUE-300 IOPAMIDOL (ISOVUE-300) INJECTION 61% COMPARISON:  None. FINDINGS: CT CHEST FINDINGS Cardiovascular: Aortic arch and branch vessel atherosclerotic vascular disease. Mediastinum/Nodes: Abnormal left supraclavicular and mediastinal lymph nodes including a 1.0 cm left supraclavicular node on image 6/2 and a 1.2 cm AP window lymph node on image 22/2, as well as a 1.0 cm upper mediastinal lymph node on image 10/2 and a 1.0 cm prevascular lymph node on image 21/2. Other notable lymph nodes include a 0.7 cm right submandibular lymph node and a 0.9 cm left axillary lymph node. Lungs/Pleura: Mild airway plugging in the right lower lobe medially. Trace right pleural effusion. Musculoskeletal: Thoracic spondylosis. There are scattered subcutaneous nodules along the chest of uncertain significance. An index nodule along the right posterior back measures 0.7 cm in short axis on image 26/2. CT ABDOMEN PELVIS FINDINGS Hepatobiliary: Hypodensity in segment 3 along the  falciform ligament is probably from focal fatty infiltration. Otherwise the liver appears unremarkable. Mildly contracted gallbladder. No biliary dilatation. Pancreas: Unremarkable Spleen: Unremarkable Adrenals/Urinary Tract: Adrenal glands unremarkable. Bilateral renal peripelvic cysts. Stomach/Bowel: A circumferential mass of the ascending colon extends over a 5.7 cm length and is associated with prominent luminal narrowing there is some mild wall thickening in the adjacent cecum. Appendix within normal limits. Descending and sigmoid colon diverticulosis without evidence of active diverticulitis. Vascular/Lymphatic: Aortoiliac atherosclerotic vascular disease. There is right gastric, porta hepatis, celiac, peripancreatic, periaortic, central mesenteric, bilateral common iliac, bilateral external iliac, and mild inguinal adenopathy. Index portacaval lymph node 2.0 cm in short axis, image 60/2. Index left periaortic lymph node 1.3 cm in short axis, image 65/2. Index right common iliac lymph node 1.2 cm in short axis on image 81/2. Index right inguinal lymph node 1.1 cm in short axis, image 109/2. Adjacent to the right colon mass, there is surrounding fluid/edema as well as soft tissue nodularity probably from local nodal metastatic disease, including a 1.0 cm lymph node on image 62/6. Reproductive: Uterus absent.  Adnexa unremarkable. Other: Scattered subcutaneous nodules, index lesion along the right anterior upper abdomen 1.1 by 1.3 cm on image 51/2. Numerous other subcutaneous nodules are present. There are also small nodules in the omentum such as the 1.4 by 0.5 cm nodule anterior to the lateral segment left hepatic lobe on image 62/2.  Nodularity along the right paracolic gutter ascites. Small amount of pelvic ascites. Musculoskeletal: We partially image a soft tissue nodule in the right gluteus maximus muscle measuring 1.3 by 1.5 cm on image 118/2. As noted above there scattered subcutaneous nodules concerning  for tumor foci. Lumbar spondylosis with bridging spurring. No compelling findings of osseous metastatic disease. IMPRESSION: 1. Circumferential mass of the ascending colon extending over a 5.7 cm length associated with prominent luminal narrowing and surrounding edema as well as surrounding tumor nodularity. Adenopathy in the lower neck, chest, upper abdomen, retroperitoneum, and pelvis with scattered subcutaneous nodules in the chest, abdomen, and pelvis; a suspected intramuscular metastatic lesion along the right gluteus maximus; and a few soft tissue nodules in the omentum. This is an unusual pattern for metastatic spread of colon adenocarcinoma, and while I cannot be certain that the subcutaneous nodules represent active tumor, the unusual distribution does raise the possibility of melanoma or lymphoma with colon involvement, or some combination of malignancies. Small amount of ascites in the right paracolic gutter and pelvis. 2. Hypodensity along the falciform ligament is probably from focal fatty infiltration. No definite hepatic metastatic lesion is seen. 3. Other imaging findings of potential clinical significance: Aortic Atherosclerosis (ICD10-I70.0). Mild airway plugging in the right lower lobe. Trace right pleural effusion. Descending and sigmoid colon diverticulosis. Electronically Signed   By: Van Clines M.D.   On: 09/17/2017 14:10    Labs:  CBC: Recent Labs    08/28/17 1548 09/10/17 1148 09/16/17 1607 09/24/17 1459 09/26/17 1103  WBC 9.4 8.9 9.2 8.0 9.1  HGB 8.5 Repeated and verified X2.* 7.7 Repeated and verified X2.* 8.8 Repeated and verified X2.*  --  9.4*  HCT 26.6 Repeated and verified X2.* 24.7* 27.6* 28.8* 29.9*  PLT 506.0* 545.0* 451.0* 458* 461*    COAGS: Recent Labs    09/26/17 1103  INR 1.12  APTT 29    BMP: Recent Labs    08/28/17 1548 09/16/17 1607 09/24/17 1459  NA 136 135 139  K 3.9 3.9 3.9  CL 103 102 104  CO2 26 25 27   GLUCOSE 110* 114* 96    BUN 18 12 14   CALCIUM 8.7 8.6 9.0  CREATININE 1.42* 1.21*  --   GFRNONAA  --   --  38*  GFRAA  --   --  44*    LIVER FUNCTION TESTS: Recent Labs    08/28/17 1548 09/24/17 1459  BILITOT 0.3 0.3  AST 22 33  ALT 20 26  ALKPHOS 50 42  PROT 7.6 7.0  ALBUMIN 3.7 3.1*    TUMOR MARKERS: No results for input(s): AFPTM, CEA, CA199, CHROMGRNA in the last 8760 hours.  Assessment and Plan: 84 y.o. female with history of recently diagnosed colon cancer and imaging findings revealing adenopathy in the lower neck, chest, upper abdomen, retroperitoneum ,pelvis along with scattered subcutaneous nodules in the chest, abdomen and pelvis, right gluteal region as well as omentum.  She presents today for ultrasound-guided subcutaneous nodule versus lymph node biopsy for further evaluation. Risks and benefits discussed with the patient/daughter including, but not limited to bleeding, infection, damage to adjacent structures or low yield requiring additional tests. All of the patient's questions were answered, patient is agreeable to proceed. Consent signed and in chart.     Thank you for this interesting consult.  I greatly enjoyed meeting ZAKHIA SERES and look forward to participating in their care.  A copy of this report was sent to the requesting provider on  this date.  Electronically Signed: D. Rowe Robert, PA-C 09/26/2017, 11:46 AM   I spent a total of  25 minutes   in face to face in clinical consultation, greater than 50% of which was counseling/coordinating care for ultrasound-guided subcutaneous nodule versus lymph node biopsy

## 2017-10-01 ENCOUNTER — Telehealth: Payer: Self-pay | Admitting: Hematology

## 2017-10-01 NOTE — Telephone Encounter (Signed)
Left message re 2/12 f/u. Schedule mailed.

## 2017-10-08 ENCOUNTER — Other Ambulatory Visit: Payer: Self-pay | Admitting: *Deleted

## 2017-10-08 ENCOUNTER — Inpatient Hospital Stay: Payer: Medicare Other | Attending: Hematology | Admitting: Hematology

## 2017-10-08 VITALS — BP 134/65 | HR 80 | Temp 97.7°F | Resp 18 | Ht 63.0 in | Wt 174.7 lb

## 2017-10-08 DIAGNOSIS — Z5111 Encounter for antineoplastic chemotherapy: Secondary | ICD-10-CM | POA: Diagnosis present

## 2017-10-08 DIAGNOSIS — I10 Essential (primary) hypertension: Secondary | ICD-10-CM | POA: Diagnosis not present

## 2017-10-08 DIAGNOSIS — D638 Anemia in other chronic diseases classified elsewhere: Secondary | ICD-10-CM | POA: Insufficient documentation

## 2017-10-08 DIAGNOSIS — N183 Chronic kidney disease, stage 3 (moderate): Secondary | ICD-10-CM | POA: Insufficient documentation

## 2017-10-08 DIAGNOSIS — D509 Iron deficiency anemia, unspecified: Secondary | ICD-10-CM | POA: Diagnosis not present

## 2017-10-08 DIAGNOSIS — C182 Malignant neoplasm of ascending colon: Secondary | ICD-10-CM

## 2017-10-08 DIAGNOSIS — C792 Secondary malignant neoplasm of skin: Secondary | ICD-10-CM | POA: Diagnosis not present

## 2017-10-08 DIAGNOSIS — Z79899 Other long term (current) drug therapy: Secondary | ICD-10-CM | POA: Insufficient documentation

## 2017-10-08 DIAGNOSIS — R7989 Other specified abnormal findings of blood chemistry: Secondary | ICD-10-CM | POA: Diagnosis not present

## 2017-10-08 DIAGNOSIS — Z23 Encounter for immunization: Secondary | ICD-10-CM | POA: Insufficient documentation

## 2017-10-08 DIAGNOSIS — R634 Abnormal weight loss: Secondary | ICD-10-CM | POA: Diagnosis not present

## 2017-10-08 DIAGNOSIS — Z7189 Other specified counseling: Secondary | ICD-10-CM | POA: Insufficient documentation

## 2017-10-08 MED ORDER — INFLUENZA VAC SPLIT HIGH-DOSE 0.5 ML IM SUSY
0.5000 mL | PREFILLED_SYRINGE | Freq: Once | INTRAMUSCULAR | Status: DC
  Filled 2017-10-08: qty 0.5

## 2017-10-08 MED ORDER — LIDOCAINE-PRILOCAINE 2.5-2.5 % EX CREA: TOPICAL_CREAM | CUTANEOUS | 3 refills | Status: AC

## 2017-10-08 MED ORDER — PROCHLORPERAZINE MALEATE 10 MG PO TABS: 10.0000 mg | ORAL_TABLET | Freq: Four times a day (QID) | ORAL | 1 refills | Status: AC | PRN

## 2017-10-08 MED ORDER — ONDANSETRON HCL 8 MG PO TABS: 8.0000 mg | ORAL_TABLET | Freq: Two times a day (BID) | ORAL | 1 refills | Status: AC | PRN

## 2017-10-08 MED ORDER — INFLUENZA VAC SPLIT HIGH-DOSE 0.5 ML IM SUSY
0.5000 mL | PREFILLED_SYRINGE | Freq: Once | INTRAMUSCULAR | Status: AC
  Administered 2017-10-08: 0.5 mL via INTRAMUSCULAR
  Filled 2017-10-08: qty 0.5

## 2017-10-08 NOTE — Telephone Encounter (Signed)
Printed avs and calender for upcoming appointments. Per 2/12 los 

## 2017-10-08 NOTE — Progress Notes (Signed)
START ON PATHWAY REGIMEN - Colorectal     A cycle is every 14 days:     Oxaliplatin      Leucovorin      5-Fluorouracil      5-Fluorouracil      Bevacizumab   **Always confirm dose/schedule in your pharmacy ordering system**    Patient Characteristics: Metastatic Colorectal, First Line, Potentially Resectable, KRAS Mutation Positive/Unknown, BRAF Wild-Type/Unknown, Bevacizumab Eligible Current evidence of distant metastases<= Yes AJCC T Category: TX AJCC N Category: NX AJCC M Category: M1b AJCC 8 Stage Grouping: IVB BRAF Mutation Status: Awaiting Test Results KRAS/NRAS Mutation Status: Awaiting Test Results Line of therapy: First Line Would you be surprised if this patient died  in the next year<= I would be surprised if this patient died in the next year Bevacizumab Eligibility: Eligible Intent of Therapy: Non-Curative / Palliative Intent, Discussed with Patient

## 2017-10-09 ENCOUNTER — Telehealth: Payer: Self-pay | Admitting: *Deleted

## 2017-10-09 ENCOUNTER — Encounter: Payer: Self-pay | Admitting: Hematology

## 2017-10-09 NOTE — Progress Notes (Signed)
Janet Barnes  Telephone:([phone] Fax:([phone]) [phone]  Clinic Follow up Note   Patient Care Team: Willey Blade, MD as PCP - General (Internal Medicine) Ileana Roup, MD as Consulting Physician (General Surgery) Truitt Merle, MD as Consulting Physician (Hematology) 10/08/2017  CHIEF COMPLAINS: f/u metastatic colon cancer   SUMMARY OF ONCOLOGIC HISTORY: Oncology History   Cancer Staging Malignant neoplasm of ascending colon [facility]) Staging form: Colon and Rectum, AJCC 8th Edition - Clinical: Stage IVB (cTX, cNX, cM1b) - Unsigned       Malignant neoplasm of ascending colon ([place])   09/16/2017 Procedure    Colonoscopy per Dr. Hilarie Fredrickson - The digital rectal exam was normal. - A fungating and ulcerated partially obstructing large mass was found in the distal ascending colon. The mass was circumferential. Oozing was present. The pediatric colonoscopy would not tranverse this lesion. This was biopsied with a cold forceps for histology. Area distal to the mass was tattooed with injections of total 5 mL of Spot (carbon black). - Multiple small and large-mouthed diverticula were found in the sigmoid colon, descending colon and transverse colon. - Internal hemorrhoids were found during retroflexion. The hemorrhoids were small.  IMPRESSION - Malignant partially obstructing tumor in the distal ascending colon. Biopsied. Tattooed. - Moderate diverticulosis in the sigmoid colon, in the descending colon and in the transverse colon. - Internal hemorrhoids.      09/16/2017 Procedure    EGD IMPRESSION - Normal esophagus. - Erythematous mucosa in the antrum. Biopsied. - Normal examined duodenum. Biopsied.        09/17/2017 Imaging    CT CAP IMPRESSION: 1. Circumferential mass of the ascending colon extending over a 5.7 cm length associated with prominent luminal narrowing and surrounding edema as well as surrounding tumor nodularity. Adenopathy in the lower  neck, chest, upper abdomen, retroperitoneum, and pelvis with scattered subcutaneous nodules in the chest, abdomen, and pelvis; a suspected intramuscular metastatic lesion along the right gluteus maximus; and a few soft tissue nodules in the omentum. This is an unusual pattern for metastatic spread of colon adenocarcinoma, and while I cannot be certain that the subcutaneous nodules represent active tumor, the unusual distribution does raise the possibility of melanoma or lymphoma with colon involvement, or some combination of malignancies. Small amount of ascites in the right paracolic gutter and pelvis. 2. Hypodensity along the falciform ligament is probably from focal fatty infiltration. No definite hepatic metastatic lesion is seen. 3. Other imaging findings of potential clinical significance: Aortic Atherosclerosis (ICD10-I70.0). Mild airway plugging in the right lower lobe. Trace right pleural effusion. Descending and sigmoid colon diverticulosis.       09/25/2017 Initial Diagnosis    Malignant neoplasm of ascending colon ([place])      HISTORY OF PRESENT ILLNESS (09/24/2017) Janet Barnes 84 y.o. female is here because of newly diagnosed cancer of the ascending colon. She was in her usual state of health until she developed bronchitis late December 2018. She was prescribed cough syrup but only took 2 doses because she noted decreased appetite and fatigue after taking it. Her bronchitis resolved but remained fatigued. She was due for her previously scheduled colonoscopy in January 2019 and developed stabbing left lower abdomina pain after eating with intermittent nausea and diarrhea. Labs performed at GI with significant anemia with low iron studies. Colonoscopy found a fungating and ulcerated partially obstructing large mass in the distal ascending colon which was circumferential. EGD with normal esophagus and duodenum but with erythematous mucosa in the gastric antrum  which was  biopsied. Staging CT CAP revealed cirfumferential mass in the ascending colon extending over a 5.7 cm length associated with prominent luminal narrowing and surrounding edema as well as surrounding tumor nodularity. Also notable for adenopathy in the lower neck, chest, upper abdomen, retroperitoneum and pelvis with scattered subcutaneous nodules in the chest, abdomen, and pelvis; there is suspected intramuscular metastatic lesion along the right gluteus maximus.   Past medical history is significant for anemia but has not previously been prescribed oral iron replacement and never received blood transfusion. She has history of GERD but had not been taking PPI lately, she has since restarted pantoprazole. Positive for HTN, kidney disease, hyperlipidemia, macular degeneration, and retinal detachment. She has had hysterectomy but no other abdominal surgeries. Family history is strongly positive for colon cancer in her mother, pancreatic cancer in her sister. She has two children who are alive and healthy. She continues to be active in her community with church work and goes to the gym 4 times per week; she drives her self, lives alone, and is independent of all ADLs. Her children live out of town but are here for consult today. Daughter has ample time off with work if she should need more care during course of treatment.   Today she feels well overall. Denies abdominal pain at this time as it occurs mostly at night when she lies down. Does not want to take norco due to hydrocodone component. Reports 3 recent episodes of blood in stool without frank red blood per rectum. She received 2 doses of IV feraheme on 08/29/17 and 09/06/17, tolerated the first infusion but developed dyspnea, itching, nausea, and multiple subcutaneous skin nodules later in the day after second Feraheme infusion.  Denies vomiting, dysphagia, or weight loss. She is most concerned about multiple nontender skin nodules that appeared the day of her  2nd Feraheme infusion, one on her chin appears to be enlarging.  CURRENT THERAPY: pending first line chemo mFOLFOX and Avastin   INTERVAL HISTORY: Pt returns for follow up. She is accompanied by her son to my clinic. She reports she has noticed growing and more new subcutaneous nodes since her last visit. She has mild intermittent abdominal discomfort, no nausea, BM is normal. She lost about 3 lbs in the past 2 weeks   REVIEW OF SYSTEMS:   Constitutional: Denies fevers, chills or abnormal weight loss, (+) mild fatigue and weight loss  Eyes: Denies blurriness of vision Ears, nose, mouth, throat, and face: Denies mucositis or sore throat Respiratory: Denies cough, dyspnea or wheezes Cardiovascular: Denies palpitation, chest discomfort or lower extremity swelling Gastrointestinal:  Denies nausea, heartburn or change in bowel habits Skin: (+) diffuse subcutaneous nodules  Lymphatics: Denies new lymphadenopathy or easy bruising Neurological:Denies numbness, tingling or new weaknesses Behavioral/Psych: Mood is stable, no new changes  All other systems were reviewed with the patient and are negative.  MEDICAL HISTORY:  Past Medical History:  Diagnosis Date  . Allergy    seasonal  . Anemia   . Blood transfusion without reported diagnosis    IV Feraheme  . Chronic kidney disease   . GERD (gastroesophageal reflux disease)   . Hyperlipidemia   . Hypertension   . Macular degeneration   . Retinal break of left eye 2013    SURGICAL HISTORY: Past Surgical History:  Procedure Laterality Date  . ABDOMINAL HYSTERECTOMY  1977    I have reviewed the social history and family history with the patient and they are unchanged from previous  note.  ALLERGIES:  is allergic to feraheme [ferumoxytol]; codeine; and sulfur.  MEDICATIONS:  Current Outpatient Medications  Medication Sig Dispense Refill  . BYSTOLIC 10 MG tablet Take 1 tablet by mouth Daily.    . Cholecalciferol (VITAMIN D) 2000 UNITS  tablet Take 2,000 Units by mouth daily.    . Coenzyme Q10 (CO Q 10) 60 MG CAPS Take 1 capsule by mouth daily.    . Fenofibrate 40 MG TABS Take by mouth daily.    Marland Kitchen HYDROcodone-acetaminophen (NORCO/VICODIN) 5-325 MG tablet Take 1 tablet by mouth every 6-8 hours as needed for pain 60 tablet 0  . Magnesium Hydroxide (MAGNESIA PO) Take 10 mg by mouth daily.    . pantoprazole (PROTONIX) 40 MG tablet Take 1 tablet (40 mg total) by mouth daily. 30 tablet 1  . spironolactone (ALDACTONE) 25 MG tablet Take 12.5 mg by mouth Daily.    Marland Kitchen lidocaine-prilocaine (EMLA) cream Apply to affected area once 30 g 3  . ondansetron (ZOFRAN) 8 MG tablet Take 1 tablet (8 mg total) by mouth 2 (two) times daily as needed for refractory nausea / vomiting. Start on day 3 after chemotherapy. 30 tablet 1  . prochlorperazine (COMPAZINE) 10 MG tablet Take 1 tablet (10 mg total) by mouth every 6 (six) hours as needed (Nausea or vomiting). 30 tablet 1   No current facility-administered medications for this visit.     PHYSICAL EXAMINATION: ECOG PERFORMANCE STATUS: 1 - Symptomatic but completely ambulatory  Vitals:   10/08/17 0918  BP: 134/65  Pulse: 80  Resp: 18  Temp: 97.7 F (36.5 C)  SpO2: 100%   Filed Weights   10/08/17 0918  Weight: 174 lb 11.2 oz (79.2 kg)    GENERAL:alert, no distress and comfortable SKIN: skin color, texture, turgor are normal, no rashes, diffuse 1-2cm subcutaneous nodes at her left cheek, submandibular,  neck, UE and trunk with mild skin redness (possible skin invasion) but no open wounds  EYES: normal, Conjunctiva are pink and non-injected, sclera clear OROPHARYNX:no exudate, no erythema and lips, buccal mucosa, and tongue normal  NECK: supple, thyroid normal size, non-tender, without nodularity LYMPH:  (+) palpable cervical, supraclavicular, and axillary lymphadenopathy 1-2cm  LUNGS: clear to auscultation and percussion with normal breathing effort HEART: regular rate & rhythm and no  murmurs and no lower extremity edema ABDOMEN:abdomen soft, non-tender and normal bowel sounds Musculoskeletal:no cyanosis of digits and no clubbing  NEURO: alert & oriented x 3 with fluent speech, no focal motor/sensory deficits  LABORATORY DATA:  I have reviewed the data as listed CBC Latest Ref Rng & Units 09/26/2017 09/24/2017 09/16/2017  WBC 4.0 - 10.5 K/uL 9.1 8.0 9.2  Hemoglobin 12.0 - 15.0 g/dL 9.4(L) - 8.8 Repeated and verified X2.(L)  Hematocrit 36.0 - 46.0 % 29.9(L) 28.8(L) 27.6(L)  Platelets 150 - 400 K/uL 461(H) 458(H) 451.0(H)     CMP Latest Ref Rng & Units 09/24/2017 09/16/2017 08/28/2017  Glucose 70 - 140 mg/dL 96 114(H) 110(H)  BUN 7 - 26 mg/dL 14 12 18   Creatinine 0.60 - 1.10 mg/dL 1.28(H) 1.21(H) 1.42(H)  Sodium 136 - 145 mmol/L 139 135 136  Potassium 3.3 - 4.7 mmol/L 3.9 3.9 3.9  Chloride 98 - 109 mmol/L 104 102 103  CO2 22 - 29 mmol/L 27 25 26   Calcium 8.4 - 10.4 mg/dL 9.0 8.6 8.7  Total Protein 6.4 - 8.3 g/dL 7.0 - 7.6  Total Bilirubin 0.2 - 1.2 mg/dL 0.3 - 0.3  Alkaline Phos 40 - 150 U/L  42 - 50  AST 5 - 34 U/L 33 - 22  ALT 0 - 55 U/L 26 - 20      RADIOGRAPHIC STUDIES: I have personally reviewed the radiological images as listed and agreed with the findings in the report. No results found.   ASSESSMENT & PLAN:  Janet Barnes is a pleasant 84 y.o. AA female with history of iron deficiency anemia and GERD now with colon mass, diffuse subcutaneous nodule and adenopathy   1. Adenocarcinoma of ascending colon, with diffuse nodes and subcuetaneous metastasis, cTxNxM1b, stage IVB -We reviewed her records, imaging, and pathology in detail.  -She presents with multiple subcutaneous skin nodules and adenopathy on CT; no evidence of metastasis in the liver -Her subcutaneous nodule biopsy showed poorly differentiated carcinoma. I have discussed with pathologist Dr. Saralyn Pilar who compared the biopsy with her initial colon mass biopsy, which showed very similar  morphology.  This is consistent with metastatic colon cancer.  I have discussed this with patient and her son in details. -Unfortunately, her disease will likely not be curable at that point but treatable, she is elderly but in good physical condition and would likely be a good candidate for systemic chemotherapy.  -I recommend first-line chemotherapy with mFOLFOX, every 2 weeks, the goal of therapy is palliative, to prolong her life and hopefully improve her quality of life. --Chemotherapy consent: Side effects including but does not not limited to, fatigue, nausea, vomiting, diarrhea, hair loss, neuropathy and cold sensitivity, fluid retention, renal and kidney dysfunction, neutropenic fever, needed for blood transfusion, bleeding, coronary artery spasm and heart attack, heart failure, were discussed with patient in great detail. She agrees to proceed. -Her metastatic colon cancer appears to be very aggressive clinically, I suspect that she may have be raft mutation.  -I will request Foundation One test on her subcutaneous biopsy tumor tissue, to see if she is a candidate for targeted therapy or immunotherapy, I spoke with path today  -Dr. Dema Severin will place a port before her treatment -I recommend her to start chemotherapy as soon as possible.  She is agreeable to start next week.  2. Anemia, Iron deficiency, and anemia of chronic disease  -She reports a history of anemia, I do not have distant records. -Recent CBC at GI office with Hgb 8.5, normal MCV; Iron 18, saturation 4%; normal but low ferritin  -S/p 2 infusions IV Feraheme 1/3 and 1/11; she had acute shortness of breath, itching, nausea with second infusion -Her anemia has improved but not resolved after IV iron, indicating component of anemia of chronic disease, likely secondary to CKD and underline malignancy    3. HTN, HL, chronic Kidney disease stage III, GERD -Continue medication regimen for chronic conditions -f/u with PCP  regularly  -We discussed the impact of chemotherapy on her blood pressure, renal function, etc.  Will monitor her closely during chemotherapy.  4. Goal of care discussion  -We again discussed the incurable nature of her cancer, and the overall poor prognosis, especially if she does not have good response to chemotherapy or progress on chemo -The patient understands the goal of care is palliative. -she is full code nw    PLAN: -Chemotherapy FOLFOX and Avastin every 2 weeks, starting next Thursday, will hold Avastin for first cycle due to the port placement.  Due to advanced age, will omit 5-FU bolus -I sent a message to Dr. Dema Severin for port placement before chemo -chemo class -I will see her before first cycle chemo   Both  patient and her son had many questions about her diagnosis, prognosis, and treatment options, all questions were answered. The patient knows to call the clinic with any problems, questions or concerns. No barriers to learning was detected. I spent 30 minutes counseling the patient face to face. The total time spent in the appointment was 40 minutes and more than 50% was on counseling and review of test results     Truitt Merle, MD 10/08/2017

## 2017-10-09 NOTE — Telephone Encounter (Signed)
Will come to chemo class on 2/19 at 12 noon. Verbalized understanding.

## 2017-10-10 ENCOUNTER — Ambulatory Visit: Payer: Medicare Other | Admitting: Hematology

## 2017-10-11 ENCOUNTER — Other Ambulatory Visit: Payer: Medicare Other

## 2017-10-14 ENCOUNTER — Encounter (HOSPITAL_COMMUNITY): Payer: Self-pay | Admitting: *Deleted

## 2017-10-14 ENCOUNTER — Ambulatory Visit: Payer: Self-pay | Admitting: Surgery

## 2017-10-14 ENCOUNTER — Other Ambulatory Visit: Payer: Self-pay

## 2017-10-14 NOTE — Progress Notes (Signed)
EKG-12/21/15 on chart  07/29/2017- LOV- PCP- Dr Willey Blade on chart  LOV- Dr Einar Gip- 2016 on chart  ECHO-2015-on chart

## 2017-10-14 NOTE — H&P (Signed)
CC: Newly diagnosed colon cancer HPI: Janet Barnes is a very pleasant 84yoF with hx of HTN, CKD presented to Dr. Hilarie Fredrickson for evaluation for anemia - she underwent colonoscopy 09/16/17 and was found to have a mass in the distal ascending colon which was not traversable with pediatric scope.  This mass was biopsied and returned adenocarcinoma.  Over the last month she is undergone iron infusions and has developed diffuse subcutaneous nodules which have been enlarging over the last 2 weeks.  She notes unintentional weight loss and occasional night sweats.  She has crampy abdominal discomfort particularly with heavier meals but is able to tolerate softer foods without issue.  She denies nausea/vomiting.  She reports having daily bowel movements.  She denies any abdominal distention.  Dr. Elmo Putt placed her on MiraLAX which she is taking.  She was seen by interventional radiology at the request of Dr. Burr Medico for the subcutaneous nodules and underwent ultrasound-guided biopsy of a nodule under the right breast.  The pathology on this is pending.  Today she denies melanotic stool or any significant abdominal pain. She underwent staging CT chest abdomen and pelvis which demonstrated a circumferential mass in the ascending colon extending over a 5.7 cm length and prominent luminal narrowing with surrounding edema.  Adenopathy in the lower neck, chest, upper abdomen, retroperitoneum and pelvis with scattered subcutaneous nodules in the chest abdomen and pelvis; suspected intramuscular metastatic lesion is noted in the right gluteus maximus.  There are a few soft tissue nodules in the omentum.  This is all concerning for possible metastatic cancer. CEA 09/24/17 - 6.4 Hgb 09/26/17 - 9.4 PMH: PSH: FHx: Denies FHx of malignancy Social: Denies use of tobacco/EtOH/drugs ROS: A comprehensive 10 system review of systems was completed with the patient and pertinent findings as noted above.  The patient is a 84 year old  female.   Past Surgical History (Tanisha A. Owens Shark, [place]; 09/30/2017 11:08 AM) Breast Biopsy   Left. Cataract Surgery   Left. Colon Polyp Removal - Colonoscopy   Foot Surgery   Bilateral. Hysterectomy (not due to cancer) - Partial   Sentinel Lymph Node Biopsy    Diagnostic Studies History (Tanisha A. Owens Shark, [place]; 09/30/2017 11:08 AM) Colonoscopy   within last year Mammogram   within last year Pap Smear   1-5 years ago  Allergies (Tanisha A. Owens Shark, RMA; 09/30/2017 11:13 AM) Sulfa Antibiotics   Codeine Phosphate *ANALGESICS - OPIOID*   Allergies Reconciled   Iron   Iron infusion  Medication History (Tanisha A. Owens Shark, [place]; 09/30/2017 11:12 AM) Hydrocodone-Acetaminophen  (5-325MG  Tablet, Oral) Active. Bystolic  (20MG  Tablet, Oral) Active. Pantoprazole Sodium  (40MG  Tablet DR, Oral) Active. GaviLyte-G  (236GM For Solution, Oral) Active. Coenzyme Q-10  (Oral) Specific strength unknown - Active. Magnesium  (Oral) Specific strength unknown - Active. Spironolactone  (25MG  Tablet, Oral) Active. Medications Reconciled   Social History (Tanisha A. Owens Shark, [place]; 09/30/2017 11:08 AM) Caffeine use   Coffee. No alcohol use   No drug use   Tobacco use   Former smoker.  Family History (Tanisha A. Owens Shark, [place]; 09/30/2017 11:08 AM) Cerebrovascular Accident   Father. Colon Cancer   Mother. Colon Polyps   Daughter. Depression   Sister. Heart Disease   Daughter, Father, Mother. Hypertension   Brother, Daughter, Father, Mother, Sister. Malignant Neoplasm Of Pancreas   Sister. Thyroid problems   Sister.  Pregnancy / Birth History (Tanisha A. Owens Shark, [place]; 09/30/2017 11:08 AM) Age at menarche   54 years. Age of menopause  46-50 Contraceptive History   Oral contraceptives. Gravida   2 Maternal age   47-30 Para   2 Regular periods    Other Problems (Tanisha A. Owens Shark, [place]; 09/30/2017 11:08 AM) Colon Cancer   Diverticulosis   Gastroesophageal Reflux Disease   Hemorrhoids   High blood pressure    Hypercholesterolemia   Other disease, cancer, significant illness   Pulmonary Embolism / Blood Clot in Legs   Ulcerative Colitis      Review of Systems (Tanisha A. Brown RMA; 09/30/2017 11:08 AM) General Present- Chills. Not Present- Appetite Loss, Fatigue, Fever, Night Sweats, Weight Gain and Weight Loss. Skin Present- New Lesions. Not Present- Change in Wart/Mole, Dryness, Hives, Jaundice, Non-Healing Wounds, Rash and Ulcer. HEENT Present- Seasonal Allergies and Visual Disturbances. Not Present- Earache, Hearing Loss, Hoarseness, Nose Bleed, Oral Ulcers, Ringing in the Ears, Sinus Pain, Sore Throat, Wears glasses/contact lenses and Yellow Eyes. Breast Not Present- Breast Mass, Breast Pain, Nipple Discharge and Skin Changes. Cardiovascular Present- Swelling of Extremities. Not Present- Chest Pain, Difficulty Breathing Lying Down, Leg Cramps, Palpitations, Rapid Heart Rate and Shortness of Breath. Gastrointestinal Present- Abdominal Pain, Bloating, Constipation, Excessive gas, Gets full quickly at meals, Hemorrhoids and Indigestion. Not Present- Bloody Stool, Change in Bowel Habits, Chronic diarrhea, Difficulty Swallowing, Nausea, Rectal Pain and Vomiting. Female Genitourinary Not Present- Frequency, Nocturia, Painful Urination, Pelvic Pain and Urgency. Musculoskeletal Not Present- Back Pain, Joint Pain, Joint Stiffness, Muscle Pain, Muscle Weakness and Swelling of Extremities. Neurological Not Present- Decreased Memory, Fainting, Headaches, Numbness, Seizures, Tingling, Tremor, Trouble walking and Weakness. Psychiatric Present- Change in Sleep Pattern. Not Present- Anxiety, Bipolar, Depression, Fearful and Frequent crying. Endocrine Not Present- Cold Intolerance, Excessive Hunger, Hair Changes, Heat Intolerance, Hot flashes and New Diabetes. Hematology Present- Gland problems. Not Present- Blood Thinners, Easy Bruising, Excessive bleeding, HIV and Persistent Infections.  Vitals (Tanisha A.  Brown RMA; 09/30/2017 11:09 AM) 09/30/2017 11:09 AM Weight: 175.4 lb   Height: 63 in  Body Surface Area: 1.83 m   Body Mass Index: 31.07 kg/m   Temp.: 98.3 F    Pulse: 86 (Regular)    BP: 134/74 (Sitting, Left Arm, Standard)       Physical Exam Harrell Gave M. Abem Shaddix MD; 09/30/2017 12:10 PM) The physical exam findings are as follows: Note: Constitutional: No acute distress; conversant; no deformities Eyes: Moist conjunctiva; no lid lag; anicteric sclerae; pupils equal round and reactive to light Neck: Trachea midline; no palpable thyromegaly Lungs: Normal respiratory effort; no tactile fremitus CV: Regular rate and rhythm; no palpable thrill; no pitting edema GI: Abdomen soft, nontender, nondistended; no palpable hepatosplenomegaly MSK: Normal gait; no clubbing/cyanosis Psychiatric: Appropriate affect; alert and oriented 3 Lymphatic: Subcutaneous nodules along right mandible, both clavicular fossas, upper back/neck; right costal margin    Assessment & Plan [place] Gave M. Bessie Livingood MD; 09/30/2017 12:14 PM) COLON CANCER (C18.9) Impression: Ms. Olsson is a very pleasant 27yoF with hx of HTN, CKD presents for evaluation of ascending colon cancer -IR biopsy of subcutaneous nodules consistent with metastatic colon cancer -She has seen Dr. Burr Medico who is planning to proceed with chemotherapy given the relative asymptomatic nature of her right colon mass in the setting of metastatic colon cancer -Dr. Burr Medico has requested a port-a-cath for chemotherapy delivery

## 2017-10-15 ENCOUNTER — Inpatient Hospital Stay: Payer: Medicare Other

## 2017-10-15 ENCOUNTER — Encounter: Payer: Self-pay | Admitting: *Deleted

## 2017-10-16 ENCOUNTER — Ambulatory Visit (HOSPITAL_COMMUNITY): Payer: Medicare Other | Admitting: Certified Registered Nurse Anesthetist

## 2017-10-16 ENCOUNTER — Encounter (HOSPITAL_COMMUNITY)
Admission: RE | Disposition: A | Discharge status: Home / Self Care | Payer: Self-pay | Source: Ambulatory Visit | Attending: Surgery

## 2017-10-16 ENCOUNTER — Ambulatory Visit (HOSPITAL_COMMUNITY): Payer: Medicare Other

## 2017-10-16 ENCOUNTER — Encounter (HOSPITAL_COMMUNITY): Payer: Self-pay

## 2017-10-16 ENCOUNTER — Ambulatory Visit (HOSPITAL_COMMUNITY)
Admission: RE | Admit: 2017-10-16 | Discharge: 2017-10-16 | Disposition: A | Discharge status: Home / Self Care | Payer: Medicare Other | Source: Ambulatory Visit | Attending: Surgery | Admitting: Surgery

## 2017-10-16 DIAGNOSIS — Z79899 Other long term (current) drug therapy: Secondary | ICD-10-CM | POA: Diagnosis not present

## 2017-10-16 DIAGNOSIS — C792 Secondary malignant neoplasm of skin: Secondary | ICD-10-CM | POA: Diagnosis not present

## 2017-10-16 DIAGNOSIS — D630 Anemia in neoplastic disease: Secondary | ICD-10-CM | POA: Diagnosis not present

## 2017-10-16 DIAGNOSIS — Z452 Encounter for adjustment and management of vascular access device: Secondary | ICD-10-CM

## 2017-10-16 DIAGNOSIS — K219 Gastro-esophageal reflux disease without esophagitis: Secondary | ICD-10-CM | POA: Insufficient documentation

## 2017-10-16 DIAGNOSIS — Z87891 Personal history of nicotine dependence: Secondary | ICD-10-CM | POA: Insufficient documentation

## 2017-10-16 DIAGNOSIS — I10 Essential (primary) hypertension: Secondary | ICD-10-CM | POA: Diagnosis not present

## 2017-10-16 DIAGNOSIS — C189 Malignant neoplasm of colon, unspecified: Secondary | ICD-10-CM | POA: Diagnosis present

## 2017-10-16 HISTORY — DX: Other specified postprocedural states: Z98.890

## 2017-10-16 HISTORY — DX: Nausea with vomiting, unspecified: R11.2

## 2017-10-16 HISTORY — DX: Malignant (primary) neoplasm, unspecified: C80.1

## 2017-10-16 HISTORY — DX: Edema, unspecified: R60.9

## 2017-10-16 HISTORY — PX: PORTACATH PLACEMENT: SHX2246

## 2017-10-16 LAB — CBC
HEMATOCRIT: 30.7 % — AB (ref 36.0–46.0)
HEMOGLOBIN: 10.1 g/dL — AB (ref 12.0–15.0)
MCH: 26.1 pg (ref 26.0–34.0)
MCHC: 32.9 g/dL (ref 30.0–36.0)
MCV: 79.3 fL (ref 78.0–100.0)
Platelets: 392 10*3/uL (ref 150–400)
RBC: 3.87 MIL/uL (ref 3.87–5.11)
RDW: 20.3 % — ABNORMAL HIGH (ref 11.5–15.5)
WBC: 13 10*3/uL — AB (ref 4.0–10.5)

## 2017-10-16 LAB — APTT: aPTT: 33 seconds (ref 24–36)

## 2017-10-16 LAB — PROTIME-INR
INR: 1.21
PROTHROMBIN TIME: 15.3 s — AB (ref 11.4–15.2)

## 2017-10-16 SURGERY — INSERTION, TUNNELED CENTRAL VENOUS DEVICE, WITH PORT
Anesthesia: General

## 2017-10-16 MED ORDER — DEXAMETHASONE SODIUM PHOSPHATE 10 MG/ML IJ SOLN
INTRAMUSCULAR | Status: DC | PRN
  Administered 2017-10-16: 10 mg via INTRAVENOUS

## 2017-10-16 MED ORDER — GABAPENTIN 300 MG PO CAPS: 300.0000 mg | ORAL_CAPSULE | Freq: Three times a day (TID) | ORAL | 2 refills | Status: DC

## 2017-10-16 MED ORDER — LIDOCAINE 2% (20 MG/ML) 5 ML SYRINGE
INTRAMUSCULAR | Status: DC | PRN
  Administered 2017-10-16: 100 mg via INTRAVENOUS

## 2017-10-16 MED ORDER — LIDOCAINE 2% (20 MG/ML) 5 ML SYRINGE
INTRAMUSCULAR | Status: AC
  Filled 2017-10-16: qty 5

## 2017-10-16 MED ORDER — CEFAZOLIN SODIUM-DEXTROSE 2-4 GM/100ML-% IV SOLN
2.0000 g | INTRAVENOUS | Status: AC
Start: 2017-10-16 — End: 2017-10-16
  Administered 2017-10-16: 2 g via INTRAVENOUS
  Filled 2017-10-16: qty 100

## 2017-10-16 MED ORDER — PROPOFOL 10 MG/ML IV BOLUS
INTRAVENOUS | Status: AC
  Filled 2017-10-16: qty 20

## 2017-10-16 MED ORDER — FENTANYL CITRATE (PF) 100 MCG/2ML IJ SOLN
INTRAMUSCULAR | Status: AC
  Filled 2017-10-16: qty 2

## 2017-10-16 MED ORDER — PROMETHAZINE HCL 25 MG/ML IJ SOLN: 6.2500 mg | INTRAMUSCULAR | Status: DC | PRN

## 2017-10-16 MED ORDER — BUPIVACAINE-EPINEPHRINE 0.25% -1:200000 IJ SOLN
INTRAMUSCULAR | Status: DC | PRN
  Administered 2017-10-16: 20 mL

## 2017-10-16 MED ORDER — HYDROMORPHONE HCL 1 MG/ML IJ SOLN: 0.2500 mg | INTRAMUSCULAR | Status: DC | PRN

## 2017-10-16 MED ORDER — BUPIVACAINE-EPINEPHRINE 0.25% -1:200000 IJ SOLN
INTRAMUSCULAR | Status: AC
  Filled 2017-10-16: qty 1

## 2017-10-16 MED ORDER — LACTATED RINGERS IV SOLN
INTRAVENOUS | Status: DC
  Administered 2017-10-16: 1000 mL via INTRAVENOUS

## 2017-10-16 MED ORDER — SODIUM CHLORIDE 0.9 % IV SOLN
INTRAVENOUS | Status: DC | PRN
  Administered 2017-10-16: 9 mL

## 2017-10-16 MED ORDER — PROPOFOL 10 MG/ML IV BOLUS
INTRAVENOUS | Status: DC | PRN
  Administered 2017-10-16: 120 mg via INTRAVENOUS

## 2017-10-16 MED ORDER — IOPAMIDOL (ISOVUE-300) INJECTION 61%
INTRAVENOUS | Status: DC | PRN
  Administered 2017-10-16: 10 mL

## 2017-10-16 MED ORDER — ONDANSETRON HCL 4 MG/2ML IJ SOLN
INTRAMUSCULAR | Status: AC
  Filled 2017-10-16: qty 2

## 2017-10-16 MED ORDER — OXYCODONE HCL 5 MG PO TABS: 5.0000 mg | ORAL_TABLET | Freq: Once | ORAL | Status: DC | PRN

## 2017-10-16 MED ORDER — CHLORHEXIDINE GLUCONATE CLOTH 2 % EX PADS: 6.0000 | MEDICATED_PAD | Freq: Once | CUTANEOUS | Status: DC

## 2017-10-16 MED ORDER — HEPARIN SOD (PORK) LOCK FLUSH 100 UNIT/ML IV SOLN: INTRAVENOUS | Status: DC | PRN

## 2017-10-16 MED ORDER — SODIUM CHLORIDE 0.9 % IV SOLN
Freq: Once | INTRAVENOUS | Status: DC
  Filled 2017-10-16: qty 1.2

## 2017-10-16 MED ORDER — DEXAMETHASONE SODIUM PHOSPHATE 10 MG/ML IJ SOLN
INTRAMUSCULAR | Status: AC
  Filled 2017-10-16: qty 1

## 2017-10-16 MED ORDER — IOPAMIDOL (ISOVUE-300) INJECTION 61%
INTRAVENOUS | Status: AC
Start: 2017-10-16 — End: 2017-10-16
  Filled 2017-10-16: qty 50

## 2017-10-16 MED ORDER — MIDAZOLAM HCL 5 MG/5ML IJ SOLN
INTRAMUSCULAR | Status: DC | PRN
  Administered 2017-10-16: 1 mg via INTRAVENOUS

## 2017-10-16 MED ORDER — FENTANYL CITRATE (PF) 100 MCG/2ML IJ SOLN
INTRAMUSCULAR | Status: DC | PRN
  Administered 2017-10-16 (×2): 25 ug via INTRAVENOUS
  Administered 2017-10-16: 50 ug via INTRAVENOUS

## 2017-10-16 MED ORDER — ONDANSETRON HCL 4 MG/2ML IJ SOLN
INTRAMUSCULAR | Status: DC | PRN
  Administered 2017-10-16: 4 mg via INTRAVENOUS

## 2017-10-16 MED ORDER — HEPARIN SOD (PORK) LOCK FLUSH 100 UNIT/ML IV SOLN
INTRAVENOUS | Status: AC
  Filled 2017-10-16: qty 5

## 2017-10-16 MED ORDER — OXYCODONE HCL 5 MG/5ML PO SOLN: 5.0000 mg | Freq: Once | ORAL | Status: DC | PRN

## 2017-10-16 MED ORDER — MIDAZOLAM HCL 2 MG/2ML IJ SOLN
INTRAMUSCULAR | Status: AC
  Filled 2017-10-16: qty 2

## 2017-10-16 SURGICAL SUPPLY — 38 items
ADH SKN CLS APL DERMABOND .7 (GAUZE/BANDAGES/DRESSINGS) ×1
APL SKNCLS STERI-STRIP NONHPOA (GAUZE/BANDAGES/DRESSINGS) ×1
BAG DECANTER FOR FLEXI CONT (MISCELLANEOUS) ×3 IMPLANT
BENZOIN TINCTURE PRP APPL 2/3 (GAUZE/BANDAGES/DRESSINGS) ×3 IMPLANT
BLADE SURG 15 STRL LF DISP TIS (BLADE) ×1 IMPLANT
BLADE SURG 15 STRL SS (BLADE) ×3
BLADE SURG SZ11 CARB STEEL (BLADE) ×3 IMPLANT
CHLORAPREP W/TINT 26ML (MISCELLANEOUS) ×3 IMPLANT
COVER SURGICAL LIGHT HANDLE (MISCELLANEOUS) ×3 IMPLANT
DECANTER SPIKE VIAL GLASS SM (MISCELLANEOUS) ×3 IMPLANT
DERMABOND ADVANCED (GAUZE/BANDAGES/DRESSINGS) ×2
DERMABOND ADVANCED .7 DNX12 (GAUZE/BANDAGES/DRESSINGS) ×1 IMPLANT
DRAPE C-ARM 42X120 X-RAY (DRAPES) ×3 IMPLANT
DRAPE LAPAROSCOPIC ABDOMINAL (DRAPES) ×3 IMPLANT
ELECT PENCIL ROCKER SW 15FT (MISCELLANEOUS) ×3 IMPLANT
ELECT REM PT RETURN 15FT ADLT (MISCELLANEOUS) ×3 IMPLANT
GAUZE SPONGE 4X4 12PLY STRL (GAUZE/BANDAGES/DRESSINGS) ×3 IMPLANT
GAUZE SPONGE 4X4 16PLY XRAY LF (GAUZE/BANDAGES/DRESSINGS) ×3 IMPLANT
GLOVE BIOGEL PI IND STRL 7.0 (GLOVE) ×1 IMPLANT
GLOVE BIOGEL PI INDICATOR 7.0 (GLOVE) ×2
GLOVE SURG SS PI 7.0 STRL IVOR (GLOVE) ×3 IMPLANT
GOWN STRL REUS W/TWL LRG LVL3 (GOWN DISPOSABLE) ×3 IMPLANT
GOWN STRL REUS W/TWL XL LVL3 (GOWN DISPOSABLE) ×3 IMPLANT
KIT BASIN OR (CUSTOM PROCEDURE TRAY) ×3 IMPLANT
KIT PORT POWER 8FR ISP CVUE (Miscellaneous) ×3 IMPLANT
NEEDLE HYPO 22GX1.5 SAFETY (NEEDLE) ×3 IMPLANT
PACK BASIC VI WITH GOWN DISP (CUSTOM PROCEDURE TRAY) ×3 IMPLANT
SUT MNCRL AB 4-0 PS2 18 (SUTURE) ×3 IMPLANT
SUT PROLENE 2 0 SH DA (SUTURE) ×4 IMPLANT
SUT VIC AB 2-0 SH 18 (SUTURE) IMPLANT
SUT VIC AB 2-0 SH 27 (SUTURE)
SUT VIC AB 2-0 SH 27X BRD (SUTURE) IMPLANT
SUT VIC AB 3-0 SH 27 (SUTURE) ×3
SUT VIC AB 3-0 SH 27XBRD (SUTURE) ×1 IMPLANT
SYR 10ML LL (SYRINGE) ×3 IMPLANT
SYR 20CC LL (SYRINGE) ×3 IMPLANT
TOWEL OR 17X26 10 PK STRL BLUE (TOWEL DISPOSABLE) ×3 IMPLANT
TOWEL OR NON WOVEN STRL DISP B (DISPOSABLE) ×3 IMPLANT

## 2017-10-16 NOTE — Anesthesia Procedure Notes (Signed)
Procedure Name: LMA Insertion Date/Time: 10/16/2017 12:15 PM Performed by: Brady Plant D, CRNA Pre-anesthesia Checklist: Patient identified, Emergency Drugs available, Suction available and Patient being monitored Patient Re-evaluated:Patient Re-evaluated prior to induction Oxygen Delivery Method: Circle system utilized Preoxygenation: Pre-oxygenation with 100% oxygen Induction Type: IV induction Ventilation: Mask ventilation without difficulty LMA: LMA inserted LMA Size: 4.0 Tube type: Oral Number of attempts: 1 Placement Confirmation: positive ETCO2 and breath sounds checked- equal and bilateral Tube secured with: Tape Dental Injury: Teeth and Oropharynx as per pre-operative assessment

## 2017-10-16 NOTE — Discharge Instructions (Signed)
POST OP INSTRUCTIONS  1. DIET: As tolerated. Be sure to include lots of fluids daily.  2. Take your usually prescribed home medications unless otherwise directed. Gabapentin has additionally been prescribed.  3. PAIN CONTROL: a. Pain is best controlled by a usual combination of three different methods TOGETHER: i. Ice/Heat ii. Over the counter pain medication iii. Prescription pain medication b. Most patients will experience some swelling and bruising around the surgical site.  Ice packs or heating pads (30-60 minutes up to 6 times a day) will help. Some people prefer to use ice alone, heat alone, alternating between ice & heat.  Experiment to what works for you.  Swelling and bruising can take several weeks to resolve.   c. It is helpful to take an over-the-counter pain medication regularly for the first few weeks: i. Ibuprofen (Motrin/Advil) - 200mg  tabs - take 3 tabs (600mg ) every 6 hours as needed for pain ii. Acetaminophen (Tylenol) - you may take 650mg  every 6 hours as needed. You can take this with motrin as they act differently on the body. If you are taking a narcotic pain medication that has acetaminophen in it, do not take over the counter tylenol at the same time.  Iii. NOTE: You may take both of these medications together - most patients  find it most helpful when alternating between the two (i.e. Ibuprofen at 6am,  tylenol at 9am, ibuprofen at 12pm ...) d. A  prescription for pain medication should be given to you upon discharge.  Take your pain medication as prescribed if your pain is not adequatly controlled with the over-the-counter pain reliefs mentioned above.  4. Dressing: Your incision is covered in Dermabond which is like sterile superglue for the skin. This will come off on it's own in a couple weeks. It is waterproof and you may bathe normally starting the day after your surgery in a shower. Avoid baths/pools/lakes/oceans until your wounds have fully healed.  5. ACTIVITIES  as tolerated:   a. You may resume regular (light) daily activities beginning the next day--such as daily self-care, walking, climbing stairs--gradually increasing activities as tolerated.  If you can walk 30 minutes without difficulty, it is safe to try more intense activity such as jogging, treadmill, bicycling, low-impact aerobics.  b. DO NOT PUSH THROUGH PAIN.  Let pain be your guide: If it hurts to do something, don't do it. c. You may drive when you are no longer taking prescription pain medication, you can comfortably wear a seatbelt, and you can safely maneuver your car and apply brakes.  Follow-up: Dr. Burr Medico will follow your port and should you have questions or concerns about it, please let us know.   When to call us [phone]: 1. Poor pain control 2. Reactions / problems with new medications (rash/itching, etc)  3. Fever over 101.5 F (38.5 C) 4. Inability to urinate 5. Nausea/vomiting 6. Worsening swelling or bruising 7. Continued bleeding from incision. 8. Increased pain, redness, or drainage from the incision  The clinic staff is available to answer your questions during regular business hours (8:30am-5pm).  Please dont hesitate to call and ask to speak to one of our nurses for clinical concerns.   A surgeon from [facility] is always on call at the hospitals   If you have a medical emergency, go to the nearest emergency room or call 911.  [facility], [place] [address], [place], [place], [place]  [address] MAIN: [phone] FAX: [phone] [email or web address]

## 2017-10-16 NOTE — Transfer of Care (Signed)
Immediate Anesthesia Transfer of Care Note  Patient: Janet Barnes  Procedure(s) Performed: INSERTION PORT-A-CATH (N/A )  Patient Location: PACU  Anesthesia Type:General  Level of Consciousness: awake, alert  and oriented  Airway & Oxygen Therapy: Patient Spontanous Breathing and Patient connected to face mask oxygen  Post-op Assessment: Report given to RN and Post -op Vital signs reviewed and stable  Post vital signs: Reviewed and stable  Last Vitals:  Vitals:   10/16/17 0956  BP: 134/83  Pulse: 73  Resp: 16  Temp: 36.9 C  SpO2: 95%    Last Pain:  Vitals:   10/16/17 1007  TempSrc:   PainSc: 0-No pain      Patients Stated Pain Goal: 4 (44/62/[id number])  Complications: No apparent anesthesia complications

## 2017-10-16 NOTE — Op Note (Signed)
10/16/2017  1:51 PM  PATIENT:  Janet Barnes  84 y.o. female  Patient Care Team: Willey Blade, MD as PCP - General (Internal Medicine) Ileana Roup, MD as Consulting Physician (General Surgery) Truitt Merle, MD as Consulting Physician (Hematology)  PRE-OPERATIVE DIAGNOSIS:  Metastatic colon cancer  POST-OPERATIVE DIAGNOSIS:  Metastatic colon cancer  PROCEDURE:  Placement of right internal jugular Port-A-Cath  SURGEON:  Sharon Mt. Jeromy Borcherding, MD  ASSISTANT: None  ANESTHESIA:   general  COUNTS:  Sponge, needle and instrument counts were reported correct x2 at the conclusion of the operation.  EBL: 2cc  DRAINS: None  SPECIMEN: Right subcutaneous chest wall nodule - probable colon cancer metastasis.  FINDINGS: Right IJ accessed on 1st attempt under ultrasound guidance.  DISPOSITION: PACU in satisfactory condition  INDICATION: The anatomy and physiology of the neck was discussed in detail using pictures/diagrams. The pathophysiology of metastatic colon cancer and need for systemic therapy as discussed by Dr. Burr Medico was also detailed. The planned procedure, material risks (including but not limited to pain, bleeding, infection of port, pneumothorax, hemothorax, hematoma, seroma, need for additional procedures, malpositioning of port, cancer of port site scar, heart attack, stroke, death) benefits and alternatives were discussed at length with the patient and her son Araceli Bouche. He and her questions were answered to their satisfaction and they elected to proceed with the procedure.  DESCRIPTION: The patient was identified in preop holding and taken to the OR where they were placed on the operating room table and SCDs were placed. General anesthesia with LMA was induced without difficulty. The patient was then prepped and draped in the usual sterile fashion. A surgical timeout was performed indicating the correct patient, procedure, positioning and need for preoperative  antibiotics.   Under ultrasound guidance the right internal jugular vein was accessed on the first attempt.  The J-wire was advanced and the needle was removed.  Fluoroscopy confirmed the wire set within the right thorax did not cross midline.  Attention was turned to creating the pocket for the port.  In a location away from any obvious cutaneous metastatic disease, local anesthetic was infiltrated and incision was made.  This was carried down to the subcutaneous tissue to the pectoralis fascia.  A pocket was created inferiorly.  During this a small 1 x 1 cm nodule was encountered lying on top of the pectoralis fascia and was excised and sent off the specimen.  2-0 Prolene sutures were used to anchor the port to the underlying fascia but the port was left extracorporeally.  Under fluoroscopic guidance a peel-away sheath was then introduced down the wire.  The wire and dilator were removed.  The port tubing was then advanced down the sheath.  The peel-away sheath was then cracked and removed.  A tunneling device was then used to connect the port site to the location of introduction of the right neck.  The catheter was then passed and tunneled.  Local anesthetic was infiltrated into the subcutaneous tissue around the tubing.  Using dilute Isovue contrast within the port tubing fluoroscopy was used to adequately position the tubing at the cavoatrial junction.  The tubing was then connected to the port with the overlying plastic cuff securing it in place.  The three 2-0 Prolene sutures were then tied, anchoring the port to the underlying pectoralis fascia.  Hemostasis was verified.  The port was then tested and aspirated blood and flushed easily.  The port was then "locked" with heparinized saline.  3-0 Vicryl deep dermal  sutures were placed at the site where the port was.  The skin of the neck stab incision as well as the port were then closed with a running 4-0 monocryl subcuticular suture. Dermabond was then used  to dress the incisions. The patient was then awakened from anesthesia, transferred to a stretcher, and transported to PACU in satisfactory condition. A CXR has been ordered.

## 2017-10-16 NOTE — Anesthesia Postprocedure Evaluation (Signed)
Anesthesia Post Note  Patient: Janet Barnes  Procedure(s) Performed: INSERTION PORT-A-CATH (N/A )     Patient location during evaluation: PACU Anesthesia Type: General Level of consciousness: awake and alert Pain management: pain level controlled Vital Signs Assessment: post-procedure vital signs reviewed and stable Respiratory status: spontaneous breathing, nonlabored ventilation and respiratory function stable Cardiovascular status: blood pressure returned to baseline and stable Postop Assessment: no apparent nausea or vomiting Anesthetic complications: no    Last Vitals:  Vitals:   10/16/17 1430 10/16/17 1444  BP: 137/62 137/67  Pulse:    Resp: 19 16  Temp: (!) 36.4 C   SpO2: 100% 100%    Last Pain:  Vitals:   10/16/17 1430  TempSrc:   PainSc: 0-No pain                 Lynda Rainwater

## 2017-10-16 NOTE — Anesthesia Preprocedure Evaluation (Signed)
Anesthesia Evaluation  Patient identified by MRN, date of birth, ID band Patient awake    Reviewed: Allergy & Precautions, NPO status , Patient's Chart, lab work & pertinent test results  History of Anesthesia Complications (+) PONV  Airway Mallampati: II  TM Distance: >3 FB Neck ROM: Full    Dental no notable dental hx.    Pulmonary neg pulmonary ROS, former smoker,    Pulmonary exam normal breath sounds clear to auscultation       Cardiovascular hypertension, Pt. on medications negative cardio ROS Normal cardiovascular exam Rhythm:Regular Rate:Normal     Neuro/Psych negative neurological ROS  negative psych ROS   GI/Hepatic negative GI ROS, Neg liver ROS, GERD  ,  Endo/Other  negative endocrine ROS  Renal/GU negative Renal ROS  negative genitourinary   Musculoskeletal negative musculoskeletal ROS (+)   Abdominal   Peds negative pediatric ROS (+)  Hematology negative hematology ROS (+) anemia ,   Anesthesia Other Findings Colon cancer  Reproductive/Obstetrics negative OB ROS                             Anesthesia Physical Anesthesia Plan  ASA: III  Anesthesia Plan: General   Post-op Pain Management:    Induction: Intravenous  PONV Risk Score and Plan: 4 or greater and Treatment may vary due to age or medical condition, Ondansetron, Dexamethasone and Midazolam  Airway Management Planned: LMA  Additional Equipment:   Intra-op Plan:   Post-operative Plan: Extubation in OR  Informed Consent: I have reviewed the patients History and Physical, chart, labs and discussed the procedure including the risks, benefits and alternatives for the proposed anesthesia with the patient or authorized representative who has indicated his/her understanding and acceptance.   Dental advisory given  Plan Discussed with: CRNA  Anesthesia Plan Comments:         Anesthesia Quick  Evaluation

## 2017-10-16 NOTE — H&P (Signed)
H&P Update  The H&P from 09/30/17 was reviewed by myself with the patient. The only interval change she reports at this time is progression of her subcutaneous nodules (metastatic colon cancer) - notices new ones every 2 days. They have also doubled in size in 2 weeks. She is tolerating a diet. She denies nausea/vomiting. She is having daily bowel movements. She is intermittent right crampy abdominal pain occasionally at night. Today, she denies any abdominal pain whatsoever. At this point, I agree that systemic therapy has the most potential to alter survival and any palliative surgery will delay initiation of systemic therapy for at least 4-6 weeks. We discussed the possibility of progression of the colon primary and she is considering all of her options at this time should this arise. We did discuss the overall rapid progression of her disease and potentially poor prognosis.  Vitals:   10/16/17 0956 10/16/17 1007  BP: 134/83   Pulse: 73   Resp: 16   Temp: 98.4 F (36.9 C)   TempSrc: Oral   SpO2: 95%   Weight:  78.9 kg (174 lb)  Height:  5\' 3"  (1.6 m)   Gen: NAD, comfortable CV: RRR Abd: Soft, nontender, nondistended Ext: 1+ pitting edema to bilateral lower extremities  Assessment/Plan Metastatic colon cancer - cutaneous metastases which are rapidly progressing. She and her son are aware of all of this and the near certain inability to cure. -OR today for port-a-cath placement for systemic chemo by Dr. Burr Medico which is tentatively planned to start tomorrow. -The planned procedure, material risks (including but not limited to pain, bleeding, infection of port, pneumothorax, hemothorax, hematoma, seroma, need for additional procedures, malpositioning of port, cancer of port site scar, heart attack, stroke, death) benefits and alternatives were discussed at length with the patient and her son Araceli Bouche. He and her questions were answered to their satisfaction and they elected to proceed with the  procedure.  Sharon Mt. Dema Severin, M.D. General and Colorectal Surgery [facility]

## 2017-10-17 ENCOUNTER — Encounter (HOSPITAL_COMMUNITY): Payer: Self-pay | Admitting: Surgery

## 2017-10-17 ENCOUNTER — Inpatient Hospital Stay: Payer: Medicare Other

## 2017-10-17 ENCOUNTER — Inpatient Hospital Stay: Payer: Medicare Other | Admitting: Nutrition

## 2017-10-17 ENCOUNTER — Telehealth: Payer: Self-pay | Admitting: Hematology

## 2017-10-17 ENCOUNTER — Encounter: Payer: Medicare Other | Admitting: Nutrition

## 2017-10-17 ENCOUNTER — Other Ambulatory Visit: Payer: Medicare Other

## 2017-10-17 ENCOUNTER — Inpatient Hospital Stay: Payer: Medicare Other | Admitting: Nurse Practitioner

## 2017-10-17 ENCOUNTER — Other Ambulatory Visit: Payer: Self-pay | Admitting: *Deleted

## 2017-10-17 VITALS — BP 133/66 | HR 74 | Temp 98.1°F | Resp 18 | Ht 63.0 in | Wt 171.6 lb

## 2017-10-17 DIAGNOSIS — Z95828 Presence of other vascular implants and grafts: Secondary | ICD-10-CM | POA: Insufficient documentation

## 2017-10-17 DIAGNOSIS — Z5111 Encounter for antineoplastic chemotherapy: Secondary | ICD-10-CM | POA: Diagnosis not present

## 2017-10-17 DIAGNOSIS — D509 Iron deficiency anemia, unspecified: Secondary | ICD-10-CM

## 2017-10-17 DIAGNOSIS — C182 Malignant neoplasm of ascending colon: Secondary | ICD-10-CM

## 2017-10-17 DIAGNOSIS — D638 Anemia in other chronic diseases classified elsewhere: Secondary | ICD-10-CM | POA: Diagnosis not present

## 2017-10-17 DIAGNOSIS — N183 Chronic kidney disease, stage 3 (moderate): Secondary | ICD-10-CM | POA: Diagnosis not present

## 2017-10-17 DIAGNOSIS — I10 Essential (primary) hypertension: Secondary | ICD-10-CM

## 2017-10-17 DIAGNOSIS — R7989 Other specified abnormal findings of blood chemistry: Secondary | ICD-10-CM

## 2017-10-17 DIAGNOSIS — Z7189 Other specified counseling: Secondary | ICD-10-CM

## 2017-10-17 DIAGNOSIS — E883 Tumor lysis syndrome: Secondary | ICD-10-CM

## 2017-10-17 LAB — URINALYSIS, COMPLETE (UACMP) WITH MICROSCOPIC
BILIRUBIN URINE: NEGATIVE
Bacteria, UA: NONE SEEN
GLUCOSE, UA: NEGATIVE mg/dL
HGB URINE DIPSTICK: NEGATIVE
Ketones, ur: NEGATIVE mg/dL
NITRITE: NEGATIVE
Protein, ur: NEGATIVE mg/dL
Specific Gravity, Urine: 1.01 (ref 1.005–1.030)
pH: 5 (ref 5.0–8.0)

## 2017-10-17 LAB — CBC WITH DIFFERENTIAL (CANCER CENTER ONLY)
BASOS PCT: 0 %
Basophils Absolute: 0 10*3/uL (ref 0.0–0.1)
Eosinophils Absolute: 0.2 10*3/uL (ref 0.0–0.5)
Eosinophils Relative: 1 %
HEMATOCRIT: 31.4 % — AB (ref 34.8–46.6)
HEMOGLOBIN: 10 g/dL — AB (ref 11.6–15.9)
LYMPHS ABS: 0.4 10*3/uL — AB (ref 0.9–3.3)
LYMPHS PCT: 3 %
MCH: 25.5 pg (ref 25.1–34.0)
MCHC: 31.8 g/dL (ref 31.5–36.0)
MCV: 80.2 fL (ref 79.5–101.0)
Monocytes Absolute: 0.9 10*3/uL (ref 0.1–0.9)
Monocytes Relative: 7 %
NEUTROS ABS: 12.3 10*3/uL — AB (ref 1.5–6.5)
NEUTROS PCT: 89 %
Platelet Count: 375 10*3/uL (ref 145–400)
RBC: 3.92 MIL/uL (ref 3.70–5.45)
RDW: 22.1 % — AB (ref 11.2–14.5)
WBC Count: 13.8 10*3/uL — ABNORMAL HIGH (ref 3.9–10.3)

## 2017-10-17 LAB — IRON AND TIBC
Iron: 19 ug/dL — ABNORMAL LOW (ref 41–142)
SATURATION RATIOS: 12 % — AB (ref 21–57)
TIBC: 159 ug/dL — ABNORMAL LOW (ref 236–444)
UIBC: 140 ug/dL

## 2017-10-17 LAB — CMP (CANCER CENTER ONLY)
ALBUMIN: 2.6 g/dL — AB (ref 3.5–5.0)
ALK PHOS: 51 U/L (ref 40–150)
ALT: 22 U/L (ref 0–55)
ANION GAP: 13 — AB (ref 3–11)
AST: 32 U/L (ref 5–34)
BUN: 40 mg/dL — ABNORMAL HIGH (ref 7–26)
CALCIUM: 8.7 mg/dL (ref 8.4–10.4)
CHLORIDE: 98 mmol/L (ref 98–109)
CO2: 20 mmol/L — AB (ref 22–29)
Creatinine: 2.26 mg/dL — ABNORMAL HIGH (ref 0.60–1.10)
GFR, EST AFRICAN AMERICAN: 22 mL/min — AB (ref >60)
GFR, Estimated: 19 mL/min — ABNORMAL LOW (ref >60)
GLUCOSE: 116 mg/dL (ref 70–140)
POTASSIUM: 4.5 mmol/L (ref 3.5–5.1)
Sodium: 131 mmol/L — ABNORMAL LOW (ref 136–145)
Total Bilirubin: 0.4 mg/dL (ref 0.2–1.2)
Total Protein: 6.2 g/dL — ABNORMAL LOW (ref 6.4–8.3)

## 2017-10-17 LAB — URIC ACID: Uric Acid, Serum: 12.2 mg/dL — ABNORMAL HIGH (ref 2.6–7.4)

## 2017-10-17 LAB — CEA (IN HOUSE-CHCC): CEA (CHCC-IN HOUSE): 8.1 ng/mL — AB (ref 0.00–5.00)

## 2017-10-17 LAB — FERRITIN: FERRITIN: 285 ng/mL — AB (ref 9–269)

## 2017-10-17 MED ORDER — SODIUM CHLORIDE 0.9 % IV SOLN
Freq: Once | INTRAVENOUS | Status: AC
  Administered 2017-10-17: 14:00:00 via INTRAVENOUS

## 2017-10-17 MED ORDER — PALONOSETRON HCL INJECTION 0.25 MG/5ML
INTRAVENOUS | Status: AC
Start: 2017-10-17 — End: ?
  Filled 2017-10-17: qty 5

## 2017-10-17 MED ORDER — SODIUM CHLORIDE 0.9 % IV SOLN
3.0000 mg | Freq: Once | INTRAVENOUS | Status: AC
  Administered 2017-10-17: 3 mg via INTRAVENOUS
  Filled 2017-10-17: qty 2

## 2017-10-17 MED ORDER — SODIUM CHLORIDE 0.9 % IV SOLN
2400.0000 mg/m2 | INTRAVENOUS | Status: AC
  Administered 2017-10-17: 4500 mg via INTRAVENOUS
  Filled 2017-10-17: qty 90

## 2017-10-17 MED ORDER — PALONOSETRON HCL INJECTION 0.25 MG/5ML
0.2500 mg | Freq: Once | INTRAVENOUS | Status: AC
  Administered 2017-10-17: 0.25 mg via INTRAVENOUS

## 2017-10-17 MED ORDER — SODIUM CHLORIDE 0.9% FLUSH
10.0000 mL | INTRAVENOUS | Status: DC | PRN
  Administered 2017-10-17: 10 mL
  Filled 2017-10-17: qty 10

## 2017-10-17 MED ORDER — LEUCOVORIN CALCIUM INJECTION 350 MG
400.0000 mg/m2 | Freq: Once | INTRAVENOUS | Status: AC
  Administered 2017-10-17: 752 mg via INTRAVENOUS
  Filled 2017-10-17: qty 37.6

## 2017-10-17 MED ORDER — OXALIPLATIN CHEMO INJECTION 100 MG/20ML: 85.0000 mg/m2 | Freq: Once | INTRAVENOUS | Status: DC

## 2017-10-17 MED ORDER — DEXAMETHASONE SODIUM PHOSPHATE 10 MG/ML IJ SOLN
INTRAMUSCULAR | Status: AC
Start: 2017-10-17 — End: ?
  Filled 2017-10-17: qty 1

## 2017-10-17 MED ORDER — ALLOPURINOL 300 MG PO TABS: 300.0000 mg | ORAL_TABLET | Freq: Every day | ORAL | 1 refills | Status: AC

## 2017-10-17 MED ORDER — DEXAMETHASONE SODIUM PHOSPHATE 10 MG/ML IJ SOLN
10.0000 mg | Freq: Once | INTRAMUSCULAR | Status: AC
  Administered 2017-10-17: 10 mg via INTRAVENOUS

## 2017-10-17 NOTE — Progress Notes (Addendum)
Janet Barnes  [phone] [phone]) [phone]  Clinic Follow up Note   Patient Care Team: Janet Blade, MD as PCP - General (Internal Medicine) Janet Roup, MD as Consulting Physician (General Surgery) Janet Merle, MD as Consulting Physician (Hematology)  Date of Service: 10/17/17  CHIEF COMPLAINT: Follow-up metastatic colon cancer  SUMMARY OF ONCOLOGIC HISTORY: Oncology History   Cancer Staging Malignant neoplasm of ascending colon [facility]) Staging form: Colon and Rectum, AJCC 8th Edition - Clinical: Stage IVB (cTX, cNX, cM1b) - Unsigned       Malignant neoplasm of ascending colon ([place])   09/16/2017 Procedure    Colonoscopy per Dr. Hilarie Barnes - The digital rectal exam was normal. - A fungating and ulcerated partially obstructing large mass was found in the distal ascending colon. The mass was circumferential. Oozing was present. The pediatric colonoscopy would not tranverse this lesion. This was biopsied with a cold forceps for histology. Area distal to the mass was tattooed with injections of total 5 mL of Spot (carbon black). - Multiple small and large-mouthed diverticula were found in the sigmoid colon, descending colon and transverse colon. - Internal hemorrhoids were found during retroflexion. The hemorrhoids were small.  IMPRESSION - Malignant partially obstructing tumor in the distal ascending colon. Biopsied. Tattooed. - Moderate diverticulosis in the sigmoid colon, in the descending colon and in the transverse colon. - Internal hemorrhoids.      09/16/2017 Procedure    EGD IMPRESSION - Normal esophagus. - Erythematous mucosa in the antrum. Biopsied. - Normal examined duodenum. Biopsied.        09/17/2017 Imaging    CT CAP IMPRESSION: 1. Circumferential mass of the ascending colon extending over a 5.7 cm length associated with prominent luminal narrowing and surrounding edema as well as surrounding tumor  nodularity. Adenopathy in the lower neck, chest, upper abdomen, retroperitoneum, and pelvis with scattered subcutaneous nodules in the chest, abdomen, and pelvis; a suspected intramuscular metastatic lesion along the right gluteus maximus; and a few soft tissue nodules in the omentum. This is an unusual pattern for metastatic spread of colon adenocarcinoma, and while I cannot be certain that the subcutaneous nodules represent active tumor, the unusual distribution does raise the possibility of melanoma or lymphoma with colon involvement, or some combination of malignancies. Small amount of ascites in the right paracolic gutter and pelvis. 2. Hypodensity along the falciform ligament is probably from focal fatty infiltration. No definite hepatic metastatic lesion is seen. 3. Other imaging findings of potential clinical significance: Aortic Atherosclerosis (ICD10-I70.0). Mild airway plugging in the right lower lobe. Trace right pleural effusion. Descending and sigmoid colon diverticulosis.       09/25/2017 Initial Diagnosis    Malignant neoplasm of ascending colon ([place])      09/26/2017 Pathology Results    Diagnosis Soft tissue, biopsy, anterior rt chest palpable subcutaneous nodule - POORLY DIFFERENTIATED CARCINOMA. Microscopic Comment Immunohistochemistry is performed and the tumor is positive with MOC-31, focally positive with CDX-2 and shows weak positivity with estrogen receptor. The tumor is negative with cytokeratin 7, cytokeratin 20, GATA-3, gross cystic disease fluid protein, progesterone receptor, Napsin A, thyroid transcription factor-1 and WT-1. The immunophenotype is consistent with adenocarcinoma, and the weak estrogen receptor positivity suggests breast or gynecologic primaries. Clinical correlation is essential.      CURRENT THERAPY: pending first line chemo mFOLFOX and Avastin. Cycle 1 with leucovorin and 5FU only due to hyperuricemia and elevated  creatinine  INTERVAL HISTORY: Janet Barnes returns for follow up prior to cycle  1 FOLFOX. She had PAC placed yesterday without difficulty. Continues to report intermittent epigastric pain 8/10. Hydrocodone was not helping. Dr. Dema Barnes prescribed gabapentin yesterday and she had a full night's sleep. She is having regular BM daily. No pain or bleeding with BM. Using miralax PRN. Appetite is fair. Denies fever, chills, cough, dyspnea, nausea, vomiting, constipation, diarrhea, or dysuria. No numbness or tingling in hands or feet. She notes tenderness to right lower breast biopsy site, she is applying powder daily. Multiple skin nodules not painful but appear to be multiplying and growing.   REVIEW OF SYSTEMS:   Constitutional: Denies fevers, chills (+) weight loss (+) fair appetite  Eyes: Denies blurriness of vision Ears, nose, mouth, throat, and face: Denies mucositis or sore throat (+) multiple subQ nodules to face, left cheek, neck, arms, back, and chest  Respiratory: Denies cough, dyspnea or wheezes Cardiovascular: Denies palpitation, chest discomfort or lower extremity swelling Gastrointestinal:  Denies nausea, vomiting, constipation, diarrhea, heartburn or change in bowel habits (+) regular BM daily, with miralax Skin: Denies abnormal skin rashes (+) multiple subcutaneous nodules   Lymphatics: Denies new lymphadenopathy or easy bruising Neurological:Denies numbness, tingling or new weaknesses Behavioral/Psych: Mood is stable, no new changes  All other systems were reviewed with the patient and are negative.  MEDICAL HISTORY:  Past Medical History:  Diagnosis Date  . Allergy    seasonal  . Anemia    iroin infusion - 07/2017   . Blood transfusion without reported diagnosis    IV Feraheme  . Cancer [facility])    colon cancer   . Chronic kidney disease   . Edema    in feet started on 10/13/17- patient stated she elevated feet and went away, call into PCP- Dr Janet Barnes   . GERD (gastroesophageal  reflux disease)   . Hyperlipidemia   . Hypertension   . Macular degeneration   . Macular degeneration    receives eye injections every 6 months by Dr Janet Barnes at [facility]  . PONV (postoperative nausea and vomiting)   . Retinal break of left eye 2013    SURGICAL HISTORY: Past Surgical History:  Procedure Laterality Date  . ABDOMINAL HYSTERECTOMY  1977  . PORTACATH PLACEMENT N/A 10/16/2017   Procedure: INSERTION PORT-A-CATH;  Surgeon: Janet Roup, MD;  Location: [facility];  Service: General;  Laterality: N/A;    I have reviewed the social history and family history with the patient and they are unchanged from previous note.  ALLERGIES:  is allergic to feraheme [ferumoxytol]; codeine; and sulfur.  MEDICATIONS:  Current Outpatient Medications  Medication Sig Dispense Refill  . acetaminophen (TYLENOL) 500 MG tablet Take 500 mg by mouth every 6 (six) hours as needed for moderate pain or headache.    . Cholecalciferol (VITAMIN D) 2000 UNITS tablet Take 2,000 Units by mouth daily.    . Coenzyme Q10 (CO Q 10 PO) Take 1 capsule by mouth daily.    . fenofibrate 160 MG tablet Take 160 mg by mouth daily.     Marland Kitchen gabapentin (NEURONTIN) 300 MG capsule Take 1 capsule (300 mg total) by mouth 3 (three) times daily. 90 capsule 2  . HYDROcodone-acetaminophen (NORCO/VICODIN) 5-325 MG tablet Take 1 tablet by mouth every 6-8 hours as needed for pain (Patient taking differently: Take 1 tablet by mouth every 6 (six) hours as needed for moderate pain. ) 60 tablet 0  . Nebivolol HCl (BYSTOLIC) 20 MG TABS Take 20 mg by mouth daily.     . pantoprazole (  PROTONIX) 40 MG tablet Take 1 tablet (40 mg total) by mouth daily. 30 tablet 1  . Polyethylene Glycol 400 (BLINK TEARS OP) Place 1 drop into both eyes daily as needed (for dry eyes).    Marland Kitchen spironolactone (ALDACTONE) 50 MG tablet Take 50 mg by mouth Daily.     Marland Kitchen allopurinol (ZYLOPRIM) 300 MG tablet Take 1 tablet (300 mg total) by mouth daily. 30 tablet 1  .  lidocaine-prilocaine (EMLA) cream Apply to affected area once (Patient not taking: Reported on 10/17/2017) 30 g 3  . miconazole (MICOTIN) 2 % powder Apply topically as needed for itching.    . ondansetron (ZOFRAN) 8 MG tablet Take 1 tablet (8 mg total) by mouth 2 (two) times daily as needed for refractory nausea / vomiting. Start on day 3 after chemotherapy. (Patient not taking: Reported on 10/17/2017) 30 tablet 1  . prochlorperazine (COMPAZINE) 10 MG tablet Take 1 tablet (10 mg total) by mouth every 6 (six) hours as needed (Nausea or vomiting). (Patient not taking: Reported on 10/17/2017) 30 tablet 1   No current facility-administered medications for this visit.    Facility-Administered Medications Ordered in Other Visits  Medication Dose Route Frequency Provider Last Rate Last Dose  . fluorouracil (ADRUCIL) 4,500 mg in sodium chloride 0.9 % 60 mL chemo infusion  2,400 mg/m2 (Treatment Plan Recorded) Intravenous 1 day or 1 dose Janet Merle, MD   4,500 mg at 10/17/17 1657    PHYSICAL EXAMINATION: ECOG PERFORMANCE STATUS: 1 - Symptomatic but completely ambulatory  Vitals:   10/17/17 1225  BP: 133/66  Pulse: 74  Resp: 18  Temp: 98.1 F (36.7 C)  SpO2: 100%   Filed Weights   10/17/17 1225  Weight: 171 lb 9.6 oz (77.8 kg)    GENERAL:alert, no distress and comfortable SKIN: skin color, texture, turgor are normal, no rashes (+) diffuse 1-2 cm subcutaneous nodules at left check, submandibular, neck, UE, axilla, back, and trunk (+) biopsy site to right anterior chest below breast, and adjacent nodule, with mild erythema and drainage EYES: normal, Conjunctiva are pink and non-injected, sclera clear OROPHARYNX:no exudate, no erythema; lips and tongue normal   NECK: supple, thyroid normal size, non-tender, without nodularity LYMPH:  (+) palpable cervical, supraclavicular, and axillary lymphadenopathy  LUNGS: clear to auscultation bilaterally with normal breathing effort HEART: regular rate &  rhythm and no murmurs and no lower extremity edema ABDOMEN:abdomen soft, non-tender and normal bowel sounds Musculoskeletal:no cyanosis of digits and no clubbing  NEURO: alert & oriented x 3 with fluent speech, no focal motor/sensory deficits PAC placed 10/16/17, site with mild erythema, healing well   LABORATORY DATA:  I have reviewed the data as listed CBC Latest Ref Rng & Units 10/18/2017 10/17/2017 10/16/2017  WBC 3.9 - 10.3 K/uL 16.9(H) 13.8(H) 13.0(H)  Hemoglobin 12.0 - 15.0 g/dL - - 10.1(L)  Hematocrit 34.8 - 46.6 % 31.2(L) 31.4(L) 30.7(L)  Platelets 145 - 400 K/uL 352 375 392     CMP Latest Ref Rng & Units 10/18/2017 10/17/2017 09/24/2017  Glucose 70 - 140 mg/dL 109 116 96  BUN 7 - 26 mg/dL 45(H) 40(H) 14  Creatinine 0.60 - 1.10 mg/dL 2.34(H) 2.26(H) 1.28(H)  Sodium 136 - 145 mmol/L 132(L) 131(L) 139  Potassium 3.5 - 5.1 mmol/L 4.6 4.5 3.9  Chloride 98 - 109 mmol/L 99 98 104  CO2 22 - 29 mmol/L 21(L) 20(L) 27  Calcium 8.4 - 10.4 mg/dL 9.0 8.7 9.0  Total Protein 6.4 - 8.3 g/dL 6.4 6.2(L) 7.0  Total Bilirubin 0.2 - 1.2 mg/dL 1.9(H) 0.4 0.3  Alkaline Phos 40 - 150 U/L 83 51 42  AST 5 - 34 U/L 42(H) 32 33  ALT 0 - 55 U/L 23 22 26       RADIOGRAPHIC STUDIES: I have personally reviewed the radiological images as listed and agreed with the findings in the report. No results found.   ASSESSMENT & PLAN: Janet Barnes is a pleasant 84 y.o. AA female with history of iron deficiency anemia and GERD now with colon mass, diffuse subcutaneous nodule and adenopathy   1. Adenocarcinoma of ascending colon, with diffuse nodes and subcuetaneous metastasis, cTxNxM1b, stage IVB -Ms. Melnik appears stable. She had PAC placed without difficulty and attended chemo education class. She has anti-emetics ready at home. We discussed symptom management and clinical symptoms such as fever, productive cough, dyspnea, uncontrolled nausea/diarrhea, rash, severe fatigue, and dehydration that should  prompt her to call [facility]. Reviewed maintaining nutrition and adequate hydration. She has continued weight loss, will see dietician today in infusion. Labs reviewed, she has abnormalities. However, given the clinically aggressive nature of her disease, Dr. Burr Medico recommends proceeding with chemo today as planned, cycle 1 will include leucovorin and 5FU only. Avastin on hold for recent PAC placement. oxaliplatin omitted from cycle 1 given her elevated creatinine.   2. Anemia, iron deficiency, anemia of chronic disease, anemia of neoplastic disease -S/P IV Feraheme 1/11 and 1/18; she developed dyspnea, itching, and nausea 30 minutes after second infusion. Iron studies improved initially but not resolved. Hgb somewhat improved, now 10.0. Will monitor closely.  3. HTN, HL, CKD stage III, GERD -BP has been well controlled lately; continue current regimen and f/u with PCP -She has acute decrease in renal function prior to receiving chemotherapy. Suspect tumor lysis syndrome.   4. Goals of care discussion  -She understands the goal of therapy is palliative, to control the disease and prolong her life.   5. Abnormal lab values - leukocytosis, elevated creatinine, hyperuricemia  -Creatinine is elevated from her baseline, 2.26 today, and mildly hyponatremic. Nonspecific hyaline casts on UA. May be related to some degree of dehydration. STAT Uric acid markedly elevated to 12.2. K and calcium normal but still highly suspicious for tumor lysis syndrome given the clinically aggressive nature of her disease. Dr. Burr Medico recommends proceeding with cycle 1 chemotherapy today with leucovorin and 5FU pump only. Will treat aggressively as outpatient with rasburicase and 1 L NS today, will monitor serial labs every 1-2 days initially. Will get 1 L NS on 2/23 with 5FU d/c, labs and IVF on 2/25. Prescription sent to pharmacy to begin allopurinol 300 mg daily, dose reduced for renal impairment. Will plan further management based on  lab results.  -Leukocytosis may be related to underlying malignancy vs superficial skin infection at biopsy site. She is afebrile. There is erythema and mild drainage at the right chest biopsy and adjacent nodules; however, this may also represent tumor invasion of the skin. She has been applying nystatin powder. I recommend she keep clean and apply neosporin for now. If symptoms worsen or she becomes febrile will add oral antibiotics.   PLAN: -Labs reviewed, proceed with cycle 1 leucovorin and 5FU only, hold avastin and oxaliplatin -Treat aggressively as tumor lysis, rasburicase plus 1 L NS today with chemo -Begin allopurinol 300 mg daily, prescription sent  -1 L IVF 2/23 with pump d/c -Lab and 1 L IVF 2/25 -Lab, f/u, IVF 2/28 or sooner if clinical picture changes  Orders Placed This Encounter  Procedures  . Culture, Urine    Standing Status:   Future    Number of Occurrences:   1    Standing Expiration Date:   10/17/2018  . Urinalysis, Complete w Microscopic    Standing Status:   Future    Number of Occurrences:   1    Standing Expiration Date:   10/17/2018  . Uric acid    Standing Status:   Future    Number of Occurrences:   1    Standing Expiration Date:   10/17/2018  . Uric acid    Standing Status:   Standing    Number of Occurrences:   5    Standing Expiration Date:   10/17/2018   All questions were answered. The patient knows to call the clinic with any problems, questions or concerns. No barriers to learning was detected. I spent 30 minutes cunseling the patient face to face. The total time spent in the appointment was 45 minutes and more than 50% was on counseling, review of test results, and coordination of care.     Alla Feeling, NP 10/18/17   Addendum  I have seen the patient, examined her. I agree with the assessment and and plan and have edited the notes.   Mrs Savoia came in today for first cycle chemo.  She states she is eating and drinking adequately.  She  has lost about 6 pounds in the past month.  Routine labs showed acute renal failure with creatinine 2.26, I checked her uric acid which was significantly elevated at the 12.2.  This is likely tumor lysis syndrome from her rapid growing cancer. We will give her 1 dose of  rasburicase today, and start her on allopurinol.  IV fluids with 1 L today, and start her on chemotherapy with 5-FU only.  We will hold on oxaliplatin for cycle 1.  She clinically appears well, urinating wel, will try to manage her tumor lysis syndrome as outpatient.  She will return tomorrow for repeat lab, and IV fluids on day 3 with pump DC.  We will see her back next Monday for lab, IV fluids and follow-up. She knows to call us for worsening symptoms.   Janet Barnes  10/17/2017  Addendum I called her today regarding her lab result.  Repeat labs showed her uric acid has came down to normal, however her creatinine still elevated 2.34, and her total bilirubin has slightly increased to 1.9.  She feels about the same, except a mild fatigue and drowsiness.  She has been urinating well, no other new symptoms.  I encouraged her to continue hydration, she will return to our clinic tomorrow for IV fluids.  She knows to go to [facility] emergency room if she has worsening symptoms over the weekend.   Janet Barnes  10/18/2017

## 2017-10-17 NOTE — Progress Notes (Signed)
  Oncology Nurse Navigator Documentation  Navigator Location: [facility] (10/17/17 1155)   )Navigator Encounter Type: Lobby (10/17/17 1155)  Met with patient in lobby. Today is patient's first chemo treatment. I reviewed supportive resources available as she moves forward with treatment. Patient encouraged to call at any time with questions or concerns. I also explainined to patient that she can speak with our triage nurses and Dr. Ernestina Penna nurses for support as well.                     Treatment Phase: First Chemo Tx (10/17/17 1155)                  Acuity: Level 1 (10/17/17 1155)         Time Spent with Patient: 15 (10/17/17 1155)

## 2017-10-17 NOTE — Patient Instructions (Signed)
[facility] Discharge Instructions for Patients Receiving Chemotherapy  Today you received the following chemotherapy agents: Leucovorin and Fluorouracil (Adrucil, 5-FU).  To help prevent nausea and vomiting after your treatment, we encourage you to take your nausea medication as prescribed. Received Aloxi during treatment-->take Compazine (not Zofran) for the next 3 days.  If you develop nausea and vomiting that is not controlled by your nausea medication, call the clinic.   BELOW ARE SYMPTOMS THAT SHOULD BE REPORTED IMMEDIATELY:  *FEVER GREATER THAN 100.5 F  *CHILLS WITH OR WITHOUT FEVER  NAUSEA AND VOMITING THAT IS NOT CONTROLLED WITH YOUR NAUSEA MEDICATION  *UNUSUAL SHORTNESS OF BREATH  *UNUSUAL BRUISING OR BLEEDING  TENDERNESS IN MOUTH AND THROAT WITH OR WITHOUT PRESENCE OF ULCERS  *URINARY PROBLEMS  *BOWEL PROBLEMS  UNUSUAL RASH Items with * indicate a potential emergency and should be followed up as soon as possible.  Feel free to call the clinic should you have any questions or concerns. The clinic phone number is [phone].  Please show the [facility] at check-in to the Emergency Department and triage nurse.  Leucovorin injection What is this medicine? LEUCOVORIN (loo koe VOR in) is used to prevent or treat the harmful effects of some medicines. This medicine is used to treat anemia caused by a low amount of folic acid in the body. It is also used with 5-fluorouracil (5-FU) to treat colon cancer. This medicine may be used for other purposes; ask your health care provider or pharmacist if you have questions. What should I tell my health care provider before I take this medicine? They need to know if you have any of these conditions: -anemia from low levels of vitamin B-12 in the blood -an unusual or allergic reaction to leucovorin, folic acid, other medicines, foods, dyes, or preservatives -pregnant or trying to get  pregnant -breast-feeding How should I use this medicine? This medicine is for injection into a muscle or into a vein. It is given by a health care professional in a hospital or clinic setting. Talk to your pediatrician regarding the use of this medicine in children. Special care may be needed. Overdosage: If you think you have taken too much of this medicine contact a poison control center or emergency room at once. NOTE: This medicine is only for you. Do not share this medicine with others. What if I miss a dose? This does not apply. What may interact with this medicine? -capecitabine -fluorouracil -phenobarbital -phenytoin -primidone -trimethoprim-sulfamethoxazole This list may not describe all possible interactions. Give your health care provider a list of all the medicines, herbs, non-prescription drugs, or dietary supplements you use. Also tell them if you smoke, drink alcohol, or use illegal drugs. Some items may interact with your medicine. What should I watch for while using this medicine? Your condition will be monitored carefully while you are receiving this medicine. This medicine may increase the side effects of 5-fluorouracil, 5-FU. Tell your doctor or health care professional if you have diarrhea or mouth sores that do not get better or that get worse. What side effects may I notice from receiving this medicine? Side effects that you should report to your doctor or health care professional as soon as possible: -allergic reactions like skin rash, itching or hives, swelling of the face, lips, or tongue -breathing problems -fever, infection -mouth sores -unusual bleeding or bruising -unusually weak or tired Side effects that usually do not require medical attention (report to your doctor or health care professional  if they continue or are bothersome): -constipation or diarrhea -loss of appetite -nausea, vomiting This list may not describe all possible side effects. Call  your doctor for medical advice about side effects. You may report side effects to FDA at 1-800-FDA-1088. Where should I keep my medicine? This drug is given in a hospital or clinic and will not be stored at home. NOTE: This sheet is a summary. It may not cover all possible information. If you have questions about this medicine, talk to your doctor, pharmacist, or health care provider.  2018 Elsevier/Gold Standard (2008-02-17 16:50:29)  Fluorouracil, 5-FU injection What is this medicine? FLUOROURACIL, 5-FU (flure oh YOOR a sil) is a chemotherapy drug. It slows the growth of cancer cells. This medicine is used to treat many types of cancer like breast cancer, colon or rectal cancer, pancreatic cancer, and stomach cancer. This medicine may be used for other purposes; ask your health care provider or pharmacist if you have questions. COMMON BRAND NAME(S): Adrucil What should I tell my health care provider before I take this medicine? They need to know if you have any of these conditions: -blood disorders -dihydropyrimidine dehydrogenase (DPD) deficiency -infection (especially a virus infection such as chickenpox, cold sores, or herpes) -kidney disease -liver disease -malnourished, poor nutrition -recent or ongoing radiation therapy -an unusual or allergic reaction to fluorouracil, other chemotherapy, other medicines, foods, dyes, or preservatives -pregnant or trying to get pregnant -breast-feeding How should I use this medicine? This drug is given as an infusion or injection into a vein. It is administered in a hospital or clinic by a specially trained health care professional. Talk to your pediatrician regarding the use of this medicine in children. Special care may be needed. Overdosage: If you think you have taken too much of this medicine contact a poison control center or emergency room at once. NOTE: This medicine is only for you. Do not share this medicine with others. What if I miss  a dose? It is important not to miss your dose. Call your doctor or health care professional if you are unable to keep an appointment. What may interact with this medicine? -allopurinol -cimetidine -dapsone -digoxin -hydroxyurea -leucovorin -levamisole -medicines for seizures like ethotoin, fosphenytoin, phenytoin -medicines to increase blood counts like filgrastim, pegfilgrastim, sargramostim -medicines that treat or prevent blood clots like warfarin, enoxaparin, and dalteparin -methotrexate -metronidazole -pyrimethamine -some other chemotherapy drugs like busulfan, cisplatin, estramustine, vinblastine -trimethoprim -trimetrexate -vaccines Talk to your doctor or health care professional before taking any of these medicines: -acetaminophen -aspirin -ibuprofen -ketoprofen -naproxen This list may not describe all possible interactions. Give your health care provider a list of all the medicines, herbs, non-prescription drugs, or dietary supplements you use. Also tell them if you smoke, drink alcohol, or use illegal drugs. Some items may interact with your medicine. What should I watch for while using this medicine? Visit your doctor for checks on your progress. This drug may make you feel generally unwell. This is not uncommon, as chemotherapy can affect healthy cells as well as cancer cells. Report any side effects. Continue your course of treatment even though you feel ill unless your doctor tells you to stop. In some cases, you may be given additional medicines to help with side effects. Follow all directions for their use. Call your doctor or health care professional for advice if you get a fever, chills or sore throat, or other symptoms of a cold or flu. Do not treat yourself. This drug decreases your body's ability  to fight infections. Try to avoid being around people who are sick. This medicine may increase your risk to bruise or bleed. Call your doctor or health care professional  if you notice any unusual bleeding. Be careful brushing and flossing your teeth or using a toothpick because you may get an infection or bleed more easily. If you have any dental work done, tell your dentist you are receiving this medicine. Avoid taking products that contain aspirin, acetaminophen, ibuprofen, naproxen, or ketoprofen unless instructed by your doctor. These medicines may hide a fever. Do not become pregnant while taking this medicine. Women should inform their doctor if they wish to become pregnant or think they might be pregnant. There is a potential for serious side effects to an unborn child. Talk to your health care professional or pharmacist for more information. Do not breast-feed an infant while taking this medicine. Men should inform their doctor if they wish to father a child. This medicine may lower sperm counts. Do not treat diarrhea with over the counter products. Contact your doctor if you have diarrhea that lasts more than 2 days or if it is severe and watery. This medicine can make you more sensitive to the sun. Keep out of the sun. If you cannot avoid being in the sun, wear protective clothing and use sunscreen. Do not use sun lamps or tanning beds/booths. What side effects may I notice from receiving this medicine? Side effects that you should report to your doctor or health care professional as soon as possible: -allergic reactions like skin rash, itching or hives, swelling of the face, lips, or tongue -low blood counts - this medicine may decrease the number of white blood cells, red blood cells and platelets. You may be at increased risk for infections and bleeding. -signs of infection - fever or chills, cough, sore throat, pain or difficulty passing urine -signs of decreased platelets or bleeding - bruising, pinpoint red spots on the skin, black, tarry stools, blood in the urine -signs of decreased red blood cells - unusually weak or tired, fainting spells,  lightheadedness -breathing problems -changes in vision -chest pain -mouth sores -nausea and vomiting -pain, swelling, redness at site where injected -pain, tingling, numbness in the hands or feet -redness, swelling, or sores on hands or feet -stomach pain -unusual bleeding Side effects that usually do not require medical attention (report to your doctor or health care professional if they continue or are bothersome): -changes in finger or toe nails -diarrhea -dry or itchy skin -hair loss -headache -loss of appetite -sensitivity of eyes to the light -stomach upset -unusually teary eyes This list may not describe all possible side effects. Call your doctor for medical advice about side effects. You may report side effects to FDA at 1-800-FDA-1088. Where should I keep my medicine? This drug is given in a hospital or clinic and will not be stored at home. NOTE: This sheet is a summary. It may not cover all possible information. If you have questions about this medicine, talk to your doctor, pharmacist, or health care provider.  2018 Elsevier/Gold Standard (2007-12-17 13:53:16)  Rasburicase Injection What is this medicine? RASBURICASE (ras BURE i kase) breaks down uric acid in the blood. It is used to prevent and to treat high levels of uric acid caused by cancer treatment. This medicine may be used for other purposes; ask your health care provider or pharmacist if you have questions. COMMON BRAND NAME(S): Elitek What should I tell my health care provider before I  take this medicine? They need to know if you have any of these conditions: -G6PD deficiency -history of anemia -history of blood transfusion -an unusual or allergic reaction to rasburicase, yeast, mannitol, other medicines, foods, dyes, or preservatives -pregnant or trying to get pregnant -breast-feeding How should I use this medicine? This medicine is for infusion into a vein. It is given by a health care professional  in a hospital or clinic setting. Talk to your pediatrician regarding the use of this medicine in children. While this drug may be prescribed for children as young as 56 month old for selected conditions, precautions do apply. Overdosage: If you think you have taken too much of this medicine contact a poison control center or emergency room at once. NOTE: This medicine is only for you. Do not share this medicine with others. What if I miss a dose? This does not apply. What may interact with this medicine? Interactions have not been studied. Give your health care provider a list of all the medicines, herbs, non-prescription drugs, or dietary supplements you use. Also tell them if you smoke, drink alcohol, or use illegal drugs. Some items may interact with your medicine. This list may not describe all possible interactions. Give your health care provider a list of all the medicines, herbs, non-prescription drugs, or dietary supplements you use. Also tell them if you smoke, drink alcohol, or use illegal drugs. Some items may interact with your medicine. What should I watch for while using this medicine? Your condition will be monitored carefully while you are receiving this medicine. You will need to have regular blood tests during your treatment. What side effects may I notice from receiving this medicine? Side effects that you should report to your doctor or health care professional as soon as possible: -allergic reactions like skin rash, itching or hives, swelling of the face, lips, or tongue -blue color to lips or nailbeds -breathing problems -chest pain, tightness -confusion -fast, irregular heartbeat -feeling faint or lightheaded, falls -low blood pressure -seizures -unusually weak or tired -yellowing of the eyes or skin Side effects that usually do not require medical attention (report to your doctor or health care professional if they continue or are bothersome): -abdominal  pain -constipation -diarrhea -fever -headache -nausea, vomiting -swelling of the ankles, feet, hands This list may not describe all possible side effects. Call your doctor for medical advice about side effects. You may report side effects to FDA at 1-800-FDA-1088. Where should I keep my medicine? This drug is given in a hospital or clinic and will not be stored at home. NOTE: This sheet is a summary. It may not cover all possible information. If you have questions about this medicine, talk to your doctor, pharmacist, or health care provider.  2018 Elsevier/Gold Standard (2014-02-05 13:24:23)

## 2017-10-17 NOTE — Progress Notes (Signed)
84 year old female diagnosed with metastatic colon cancer.  Past medical history includes hypertension, hyperlipidemia, GERD, chronic kidney disease, and anemia.  Medications include vitamin D, coenzyme Q10, Zofran, Protonix, and Compazine.  Labs were reviewed.  Height: 63 inches. Weight: 171.6 pounds. Usual body weight: 180 pounds. BMI: 30.4.  Endorses 9 pound weight loss from usual body weight. Reports appetite is fair.  She tries to eat 3 meals a day. She enjoys drinking boost smoothies. Currently denies nutrition Impact symptoms.  Nutrition diagnosis: Food and nutrition related knowledge deficit related to metastatic colon cancer as evidenced by no prior need for nutrition related information.  Intervention:  Patient educated to consume smaller more frequent meals and snacks consisting of adequate calories and protein for weight maintenance. Encourage patient to continue boost smoothies as tolerated. Provided coupons. Provided education on potential snacks. Questions were answered teach back method used.  Contact information provided.  Monitoring evaluation goals: Patient will tolerate increase calories and protein to minimize weight loss throughout treatment.  Next visit: Thursday, March 21 during infusion.  **Disclaimer: This note was dictated with voice recognition software. Similar sounding words can inadvertently be transcribed and this note may contain transcription errors which may not have been corrected upon publication of note.**

## 2017-10-17 NOTE — Progress Notes (Unsigned)
Per Regan Rakers Burton-NP: OK to treat with pt's creatinine of 2.26. Pt will only receive Leucovorin and 5-FU plus a 1L of IVF today. Will collect a urine specimen while pt is here, and then pt will return on Monday for labs and possibly another 1L of IVF.

## 2017-10-17 NOTE — Progress Notes (Unsigned)
Spoke to Dr. Burr Medico regarding pt's 5-FU pump since it won't be put on till about 1700. Was instructed to increase the rate so that the volume infuses over 42 hours. That will change the rate from 3.3 mL/hr to 3.76mL/hr. Verified calculation with Jan-charge RN. Pump reprogrammed, and pt/daughter updated on plan and verbalized understanding.

## 2017-10-17 NOTE — Telephone Encounter (Signed)
Appointments scheduled AVS/Calendar printed per 2/21 los °

## 2017-10-18 ENCOUNTER — Telehealth: Payer: Self-pay | Admitting: Hematology

## 2017-10-18 ENCOUNTER — Inpatient Hospital Stay: Payer: Medicare Other

## 2017-10-18 ENCOUNTER — Encounter: Payer: Self-pay | Admitting: Nurse Practitioner

## 2017-10-18 DIAGNOSIS — C182 Malignant neoplasm of ascending colon: Secondary | ICD-10-CM

## 2017-10-18 DIAGNOSIS — Z5111 Encounter for antineoplastic chemotherapy: Secondary | ICD-10-CM | POA: Diagnosis not present

## 2017-10-18 DIAGNOSIS — E883 Tumor lysis syndrome: Secondary | ICD-10-CM

## 2017-10-18 DIAGNOSIS — Z7189 Other specified counseling: Secondary | ICD-10-CM | POA: Insufficient documentation

## 2017-10-18 LAB — CBC WITH DIFFERENTIAL (CANCER CENTER ONLY)
Basophils Absolute: 0.1 10*3/uL (ref 0.0–0.1)
Basophils Relative: 1 %
EOS PCT: 2 %
Eosinophils Absolute: 0.2 10*3/uL (ref 0.0–0.5)
HEMATOCRIT: 31.2 % — AB (ref 34.8–46.6)
HEMOGLOBIN: 9.9 g/dL — AB (ref 11.6–15.9)
LYMPHS ABS: 0.5 10*3/uL — AB (ref 0.9–3.3)
LYMPHS PCT: 3 %
MCH: 25.8 pg (ref 25.1–34.0)
MCHC: 31.9 g/dL (ref 31.5–36.0)
MCV: 80.7 fL (ref 79.5–101.0)
Monocytes Absolute: 0.9 10*3/uL (ref 0.1–0.9)
Monocytes Relative: 5 %
Neutro Abs: 15.1 10*3/uL — ABNORMAL HIGH (ref 1.5–6.5)
Neutrophils Relative %: 89 %
PLATELETS: 352 10*3/uL (ref 145–400)
RBC: 3.86 MIL/uL (ref 3.70–5.45)
RDW: 22.4 % — ABNORMAL HIGH (ref 11.2–14.5)
WBC: 16.9 10*3/uL — AB (ref 3.9–10.3)

## 2017-10-18 LAB — CMP (CANCER CENTER ONLY)
ALK PHOS: 83 U/L (ref 40–150)
ALT: 23 U/L (ref 0–55)
AST: 42 U/L — AB (ref 5–34)
Albumin: 2.7 g/dL — ABNORMAL LOW (ref 3.5–5.0)
Anion gap: 12 — ABNORMAL HIGH (ref 3–11)
BUN: 45 mg/dL — AB (ref 7–26)
CALCIUM: 9 mg/dL (ref 8.4–10.4)
CO2: 21 mmol/L — ABNORMAL LOW (ref 22–29)
CREATININE: 2.34 mg/dL — AB (ref 0.60–1.10)
Chloride: 99 mmol/L (ref 98–109)
GFR, EST AFRICAN AMERICAN: 21 mL/min — AB (ref >60)
GFR, EST NON AFRICAN AMERICAN: 18 mL/min — AB (ref >60)
Glucose, Bld: 109 mg/dL (ref 70–140)
Potassium: 4.6 mmol/L (ref 3.5–5.1)
Sodium: 132 mmol/L — ABNORMAL LOW (ref 136–145)
Total Bilirubin: 1.9 mg/dL — ABNORMAL HIGH (ref 0.2–1.2)
Total Protein: 6.4 g/dL (ref 6.4–8.3)

## 2017-10-18 LAB — URINE CULTURE: Culture: NO GROWTH

## 2017-10-18 LAB — URIC ACID: Uric Acid, Serum: 4.7 mg/dL (ref 2.6–7.4)

## 2017-10-18 NOTE — Telephone Encounter (Signed)
I called her today regarding her lab result.  Repeat labs showed her uric acid has came down to normal, however her creatinine still elevated 2.34, and her total bilirubin has slightly increased to 1.9.  She feels about the same, except a mild fatigue and drowsiness.  She has been urinating well, no other new symptoms.  I encouraged her to continue hydration, she will return to our clinic tomorrow for IV fluids.  She knows to go to [facility] emergency room if she has worsening symptoms over the weekend.   Truitt Merle  10/18/2017 6pm

## 2017-10-19 ENCOUNTER — Inpatient Hospital Stay: Payer: Medicare Other

## 2017-10-19 VITALS — BP 117/64 | HR 85 | Temp 97.5°F | Resp 18

## 2017-10-19 DIAGNOSIS — Z5111 Encounter for antineoplastic chemotherapy: Secondary | ICD-10-CM | POA: Diagnosis not present

## 2017-10-19 DIAGNOSIS — C182 Malignant neoplasm of ascending colon: Secondary | ICD-10-CM

## 2017-10-19 DIAGNOSIS — Z95828 Presence of other vascular implants and grafts: Secondary | ICD-10-CM

## 2017-10-19 MED ORDER — SODIUM CHLORIDE 0.9% FLUSH
10.0000 mL | INTRAVENOUS | Status: DC | PRN
  Administered 2017-10-19: 10 mL
  Filled 2017-10-19: qty 10

## 2017-10-19 MED ORDER — SODIUM CHLORIDE 0.9 % IV SOLN
Freq: Once | INTRAVENOUS | Status: AC
  Administered 2017-10-19: 11:00:00 via INTRAVENOUS

## 2017-10-19 MED ORDER — HEPARIN SOD (PORK) LOCK FLUSH 100 UNIT/ML IV SOLN
500.0000 [IU] | Freq: Once | INTRAVENOUS | Status: AC | PRN
  Administered 2017-10-19: 500 [IU]
  Filled 2017-10-19: qty 5

## 2017-10-19 NOTE — Patient Instructions (Signed)
Dehydration, Adult Dehydration is when there is not enough fluid or water in your body. This happens when you lose more fluids than you take in. Dehydration can range from mild to very bad. It should be treated right away to keep it from getting very bad. Symptoms of mild dehydration may include:  Thirst.  Dry lips.  Slightly dry mouth.  Dry, warm skin.  Dizziness. Symptoms of moderate dehydration may include:  Very dry mouth.  Muscle cramps.  Dark pee (urine). Pee may be the color of tea.  Your body making less pee.  Your eyes making fewer tears.  Heartbeat that is uneven or faster than normal (palpitations).  Headache.  Light-headedness, especially when you stand up from sitting.  Fainting (syncope). Symptoms of very bad dehydration may include:  Changes in skin, such as: ? Cold and clammy skin. ? Blotchy (mottled) or pale skin. ? Skin that does not quickly return to normal after being lightly pinched and let go (poor skin turgor).  Changes in body fluids, such as: ? Feeling very thirsty. ? Your eyes making fewer tears. ? Not sweating when body temperature is high, such as in hot weather. ? Your body making very little pee.  Changes in vital signs, such as: ? Weak pulse. ? Pulse that is more than 100 beats a minute when you are sitting still. ? Fast breathing. ? Low blood pressure.  Other changes, such as: ? Sunken eyes. ? Cold hands and feet. ? Confusion. ? Lack of energy (lethargy). ? Trouble waking up from sleep. ? Short-term weight loss. ? Unconsciousness. Follow these instructions at home:  If told by your doctor, drink an ORS: ? Make an ORS by using instructions on the package. ? Start by drinking small amounts, about  cup (120 mL) every 5-10 minutes. ? Slowly drink more until you have had the amount that your doctor said to have.  Drink enough clear fluid to keep your pee clear or pale yellow. If you were told to drink an ORS, finish the ORS  first, then start slowly drinking clear fluids. Drink fluids such as: ? Water. Do not drink only water by itself. Doing that can make the salt (sodium) level in your body get too low (hyponatremia). ? Ice chips. ? Fruit juice that you have added water to (diluted). ? Low-calorie sports drinks.  Avoid: ? Alcohol. ? Drinks that have a lot of sugar. These include high-calorie sports drinks, fruit juice that does not have water added, and soda. ? Caffeine. ? Foods that are greasy or have a lot of fat or sugar.  Take over-the-counter and prescription medicines only as told by your doctor.  Do not take salt tablets. Doing that can make the salt level in your body get too high (hypernatremia).  Eat foods that have minerals (electrolytes). Examples include bananas, oranges, potatoes, tomatoes, and spinach.  Keep all follow-up visits as told by your doctor. This is important. Contact a doctor if:  You have belly (abdominal) pain that: ? Gets worse. ? Stays in one area (localizes).  You have a rash.  You have a stiff neck.  You get angry or annoyed more easily than normal (irritability).  You are more sleepy than normal.  You have a harder time waking up than normal.  You feel: ? Weak. ? Dizzy. ? Very thirsty.  You have peed (urinated) only a small amount of very dark pee during 6-8 hours. Get help right away if:  You have symptoms of   very bad dehydration.  You cannot drink fluids without throwing up (vomiting).  Your symptoms get worse with treatment.  You have a fever.  You have a very bad headache.  You are throwing up or having watery poop (diarrhea) and it: ? Gets worse. ? Does not go away.  You have blood or something green (bile) in your throw-up.  You have blood in your poop (stool). This may cause poop to look black and tarry.  You have not peed in 6-8 hours.  You pass out (faint).  Your heart rate when you are sitting still is more than 100 beats a  minute.  You have trouble breathing. This information is not intended to replace advice given to you by your health care provider. Make sure you discuss any questions you have with your health care provider. Document Released: 06/09/2009 Document Revised: 03/02/2016 Document Reviewed: 10/07/2015 Elsevier Interactive Patient Education  2018 [employer]  

## 2017-10-21 ENCOUNTER — Ambulatory Visit (HOSPITAL_BASED_OUTPATIENT_CLINIC_OR_DEPARTMENT_OTHER): Payer: Medicare Other | Admitting: Hematology

## 2017-10-21 ENCOUNTER — Inpatient Hospital Stay: Payer: Medicare Other

## 2017-10-21 ENCOUNTER — Inpatient Hospital Stay (HOSPITAL_COMMUNITY)
Admission: AD | Admit: 2017-10-21 | Discharge: 2017-10-24 | DRG: 683 | Disposition: A | Discharge status: Home / Self Care | Payer: Medicare Other | Source: Ambulatory Visit | Attending: Family Medicine | Admitting: Family Medicine

## 2017-10-21 ENCOUNTER — Other Ambulatory Visit: Payer: Self-pay

## 2017-10-21 ENCOUNTER — Encounter (HOSPITAL_COMMUNITY): Payer: Self-pay | Admitting: *Deleted

## 2017-10-21 VITALS — BP 122/51 | HR 72 | Temp 97.7°F | Resp 16

## 2017-10-21 DIAGNOSIS — I10 Essential (primary) hypertension: Secondary | ICD-10-CM | POA: Diagnosis not present

## 2017-10-21 DIAGNOSIS — D649 Anemia, unspecified: Secondary | ICD-10-CM | POA: Diagnosis present

## 2017-10-21 DIAGNOSIS — N183 Chronic kidney disease, stage 3 (moderate): Secondary | ICD-10-CM | POA: Diagnosis present

## 2017-10-21 DIAGNOSIS — E871 Hypo-osmolality and hyponatremia: Secondary | ICD-10-CM | POA: Diagnosis present

## 2017-10-21 DIAGNOSIS — E785 Hyperlipidemia, unspecified: Secondary | ICD-10-CM | POA: Diagnosis present

## 2017-10-21 DIAGNOSIS — J3489 Other specified disorders of nose and nasal sinuses: Secondary | ICD-10-CM | POA: Diagnosis not present

## 2017-10-21 DIAGNOSIS — N1330 Unspecified hydronephrosis: Secondary | ICD-10-CM | POA: Diagnosis not present

## 2017-10-21 DIAGNOSIS — D72829 Elevated white blood cell count, unspecified: Secondary | ICD-10-CM | POA: Diagnosis present

## 2017-10-21 DIAGNOSIS — N1339 Other hydronephrosis: Secondary | ICD-10-CM | POA: Diagnosis present

## 2017-10-21 DIAGNOSIS — R600 Localized edema: Secondary | ICD-10-CM | POA: Diagnosis present

## 2017-10-21 DIAGNOSIS — C182 Malignant neoplasm of ascending colon: Secondary | ICD-10-CM | POA: Diagnosis present

## 2017-10-21 DIAGNOSIS — E883 Tumor lysis syndrome: Secondary | ICD-10-CM | POA: Diagnosis present

## 2017-10-21 DIAGNOSIS — H3530 Unspecified macular degeneration: Secondary | ICD-10-CM | POA: Diagnosis present

## 2017-10-21 DIAGNOSIS — I129 Hypertensive chronic kidney disease with stage 1 through stage 4 chronic kidney disease, or unspecified chronic kidney disease: Secondary | ICD-10-CM

## 2017-10-21 DIAGNOSIS — Z800 Family history of malignant neoplasm of digestive organs: Secondary | ICD-10-CM | POA: Diagnosis not present

## 2017-10-21 DIAGNOSIS — D6481 Anemia due to antineoplastic chemotherapy: Secondary | ICD-10-CM | POA: Diagnosis not present

## 2017-10-21 DIAGNOSIS — R740 Nonspecific elevation of levels of transaminase and lactic acid dehydrogenase [LDH]: Secondary | ICD-10-CM | POA: Diagnosis present

## 2017-10-21 DIAGNOSIS — R52 Pain, unspecified: Secondary | ICD-10-CM

## 2017-10-21 DIAGNOSIS — D630 Anemia in neoplastic disease: Secondary | ICD-10-CM | POA: Diagnosis not present

## 2017-10-21 DIAGNOSIS — D509 Iron deficiency anemia, unspecified: Secondary | ICD-10-CM | POA: Diagnosis not present

## 2017-10-21 DIAGNOSIS — R609 Edema, unspecified: Secondary | ICD-10-CM | POA: Diagnosis not present

## 2017-10-21 DIAGNOSIS — K219 Gastro-esophageal reflux disease without esophagitis: Secondary | ICD-10-CM | POA: Diagnosis present

## 2017-10-21 DIAGNOSIS — N179 Acute kidney failure, unspecified: Secondary | ICD-10-CM

## 2017-10-21 DIAGNOSIS — D638 Anemia in other chronic diseases classified elsewhere: Secondary | ICD-10-CM | POA: Diagnosis not present

## 2017-10-21 DIAGNOSIS — Z7189 Other specified counseling: Secondary | ICD-10-CM

## 2017-10-21 DIAGNOSIS — C792 Secondary malignant neoplasm of skin: Secondary | ICD-10-CM | POA: Diagnosis not present

## 2017-10-21 DIAGNOSIS — E875 Hyperkalemia: Secondary | ICD-10-CM | POA: Diagnosis present

## 2017-10-21 DIAGNOSIS — Z8249 Family history of ischemic heart disease and other diseases of the circulatory system: Secondary | ICD-10-CM

## 2017-10-21 DIAGNOSIS — C772 Secondary and unspecified malignant neoplasm of intra-abdominal lymph nodes: Secondary | ICD-10-CM | POA: Diagnosis present

## 2017-10-21 DIAGNOSIS — E790 Hyperuricemia without signs of inflammatory arthritis and tophaceous disease: Secondary | ICD-10-CM | POA: Diagnosis present

## 2017-10-21 DIAGNOSIS — Z87891 Personal history of nicotine dependence: Secondary | ICD-10-CM

## 2017-10-21 DIAGNOSIS — R599 Enlarged lymph nodes, unspecified: Secondary | ICD-10-CM | POA: Diagnosis not present

## 2017-10-21 DIAGNOSIS — Z95828 Presence of other vascular implants and grafts: Secondary | ICD-10-CM

## 2017-10-21 LAB — CBC WITH DIFFERENTIAL (CANCER CENTER ONLY)
Basophils Absolute: 0 10*3/uL (ref 0.0–0.1)
Basophils Relative: 0 %
EOS ABS: 0.2 10*3/uL (ref 0.0–0.5)
EOS PCT: 2 %
HCT: 28.1 % — ABNORMAL LOW (ref 34.8–46.6)
Hemoglobin: 9 g/dL — ABNORMAL LOW (ref 11.6–15.9)
LYMPHS ABS: 0.4 10*3/uL — AB (ref 0.9–3.3)
Lymphocytes Relative: 4 %
MCH: 25.5 pg (ref 25.1–34.0)
MCHC: 32 g/dL (ref 31.5–36.0)
MCV: 79.6 fL (ref 79.5–101.0)
Monocytes Absolute: 0.1 10*3/uL (ref 0.1–0.9)
Monocytes Relative: 1 %
Neutro Abs: 11.7 10*3/uL — ABNORMAL HIGH (ref 1.5–6.5)
Neutrophils Relative %: 93 %
PLATELETS: 244 10*3/uL (ref 145–400)
RBC: 3.53 MIL/uL — AB (ref 3.70–5.45)
RDW: 19.9 % — AB (ref 11.2–14.5)
WBC Count: 12.5 10*3/uL — ABNORMAL HIGH (ref 3.9–10.3)

## 2017-10-21 LAB — URINALYSIS, ROUTINE W REFLEX MICROSCOPIC
Bilirubin Urine: NEGATIVE
GLUCOSE, UA: NEGATIVE mg/dL
Ketones, ur: NEGATIVE mg/dL
Nitrite: NEGATIVE
Protein, ur: NEGATIVE mg/dL
SPECIFIC GRAVITY, URINE: 1.012 (ref 1.005–1.030)
pH: 5 (ref 5.0–8.0)

## 2017-10-21 LAB — TYPE AND SCREEN
ABO/RH(D): AB POS
Antibody Screen: NEGATIVE

## 2017-10-21 LAB — CMP (CANCER CENTER ONLY)
ALT: 23 U/L (ref 0–55)
AST: 29 U/L (ref 5–34)
Albumin: 2.5 g/dL — ABNORMAL LOW (ref 3.5–5.0)
Alkaline Phosphatase: 97 U/L (ref 40–150)
Anion gap: 10 (ref 3–11)
BILIRUBIN TOTAL: 1.3 mg/dL — AB (ref 0.2–1.2)
BUN: 66 mg/dL — AB (ref 7–26)
CHLORIDE: 99 mmol/L (ref 98–109)
CO2: 20 mmol/L — ABNORMAL LOW (ref 22–29)
CREATININE: 2.77 mg/dL — AB (ref 0.60–1.10)
Calcium: 8.8 mg/dL (ref 8.4–10.4)
GFR, EST NON AFRICAN AMERICAN: 15 mL/min — AB (ref >60)
GFR, Est AFR Am: 17 mL/min — ABNORMAL LOW (ref >60)
Glucose, Bld: 122 mg/dL (ref 70–140)
Potassium: 5.6 mmol/L — ABNORMAL HIGH (ref 3.5–5.1)
Sodium: 129 mmol/L — ABNORMAL LOW (ref 136–145)
TOTAL PROTEIN: 6 g/dL — AB (ref 6.4–8.3)

## 2017-10-21 LAB — BASIC METABOLIC PANEL
ANION GAP: 9 (ref 5–15)
BUN: 61 mg/dL — ABNORMAL HIGH (ref 6–20)
CALCIUM: 7.8 mg/dL — AB (ref 8.9–10.3)
CHLORIDE: 104 mmol/L (ref 101–111)
CO2: 19 mmol/L — ABNORMAL LOW (ref 22–32)
CREATININE: 2.44 mg/dL — AB (ref 0.44–1.00)
GFR calc Af Amer: 20 mL/min — ABNORMAL LOW (ref >60)
GFR calc non Af Amer: 17 mL/min — ABNORMAL LOW (ref >60)
Glucose, Bld: 108 mg/dL — ABNORMAL HIGH (ref 65–99)
Potassium: 4.9 mmol/L (ref 3.5–5.1)
SODIUM: 132 mmol/L — AB (ref 135–145)

## 2017-10-21 LAB — PHOSPHORUS: PHOSPHORUS: 4 mg/dL (ref 2.5–4.6)

## 2017-10-21 LAB — URIC ACID: Uric Acid, Serum: 3.3 mg/dL (ref 2.6–7.4)

## 2017-10-21 MED ORDER — ALLOPURINOL 300 MG PO TABS
300.0000 mg | ORAL_TABLET | Freq: Every day | ORAL | Status: DC
  Administered 2017-10-21 – 2017-10-24 (×3): 300 mg via ORAL
  Filled 2017-10-21 (×4): qty 1

## 2017-10-21 MED ORDER — SODIUM CHLORIDE 0.9 % IV SOLN
Freq: Once | INTRAVENOUS | Status: AC
  Administered 2017-10-21: 11:00:00 via INTRAVENOUS

## 2017-10-21 MED ORDER — HYDROCODONE-ACETAMINOPHEN 5-325 MG PO TABS: 1.0000 | ORAL_TABLET | Freq: Four times a day (QID) | ORAL | Status: DC | PRN

## 2017-10-21 MED ORDER — ACETAMINOPHEN 650 MG RE SUPP: 650.0000 mg | Freq: Four times a day (QID) | RECTAL | Status: DC | PRN

## 2017-10-21 MED ORDER — HEPARIN SODIUM (PORCINE) 5000 UNIT/ML IJ SOLN
5000.0000 [IU] | Freq: Three times a day (TID) | INTRAMUSCULAR | Status: DC
  Administered 2017-10-21 – 2017-10-24 (×9): 5000 [IU] via SUBCUTANEOUS
  Filled 2017-10-21 (×9): qty 1

## 2017-10-21 MED ORDER — FENOFIBRATE 160 MG PO TABS
160.0000 mg | ORAL_TABLET | Freq: Every day | ORAL | Status: DC
  Administered 2017-10-21 – 2017-10-24 (×4): 160 mg via ORAL
  Filled 2017-10-21 (×4): qty 1

## 2017-10-21 MED ORDER — ALBUTEROL SULFATE (2.5 MG/3ML) 0.083% IN NEBU: 2.5000 mg | INHALATION_SOLUTION | Freq: Four times a day (QID) | RESPIRATORY_TRACT | Status: DC | PRN

## 2017-10-21 MED ORDER — SODIUM CHLORIDE 0.9 % IV SOLN
INTRAVENOUS | Status: DC
  Administered 2017-10-21 – 2017-10-23 (×5): via INTRAVENOUS

## 2017-10-21 MED ORDER — NEBIVOLOL HCL 10 MG PO TABS
20.0000 mg | ORAL_TABLET | Freq: Every day | ORAL | Status: DC
  Administered 2017-10-22 – 2017-10-24 (×3): 20 mg via ORAL
  Filled 2017-10-21 (×3): qty 2

## 2017-10-21 MED ORDER — ALTEPLASE 2 MG IJ SOLR
2.0000 mg | Freq: Once | INTRAMUSCULAR | Status: AC | PRN
  Filled 2017-10-21: qty 2

## 2017-10-21 MED ORDER — POLYVINYL ALCOHOL 1.4 % OP SOLN
1.0000 [drp] | Freq: Every day | OPHTHALMIC | Status: DC | PRN
  Filled 2017-10-21: qty 15

## 2017-10-21 MED ORDER — ACETAMINOPHEN 325 MG PO TABS
650.0000 mg | ORAL_TABLET | Freq: Four times a day (QID) | ORAL | Status: DC | PRN
  Administered 2017-10-22 – 2017-10-23 (×2): 650 mg via ORAL
  Filled 2017-10-21 (×2): qty 2

## 2017-10-21 MED ORDER — ONDANSETRON HCL 4 MG/2ML IJ SOLN: 4.0000 mg | Freq: Four times a day (QID) | INTRAMUSCULAR | Status: DC | PRN

## 2017-10-21 MED ORDER — SODIUM CHLORIDE 0.9% FLUSH
10.0000 mL | INTRAVENOUS | Status: AC | PRN
  Filled 2017-10-21: qty 10

## 2017-10-21 MED ORDER — HEPARIN SOD (PORK) LOCK FLUSH 100 UNIT/ML IV SOLN
500.0000 [IU] | Freq: Once | INTRAVENOUS | Status: AC | PRN
  Filled 2017-10-21: qty 5

## 2017-10-21 MED ORDER — ONDANSETRON HCL 4 MG PO TABS: 4.0000 mg | ORAL_TABLET | Freq: Four times a day (QID) | ORAL | Status: DC | PRN

## 2017-10-21 MED ORDER — ACETAMINOPHEN 500 MG PO TABS: 500.0000 mg | ORAL_TABLET | Freq: Four times a day (QID) | ORAL | Status: DC | PRN

## 2017-10-21 MED ORDER — PANTOPRAZOLE SODIUM 40 MG PO TBEC
40.0000 mg | DELAYED_RELEASE_TABLET | Freq: Every day | ORAL | Status: DC
  Administered 2017-10-22 – 2017-10-24 (×3): 40 mg via ORAL
  Filled 2017-10-21 (×3): qty 1

## 2017-10-21 MED ORDER — MORPHINE SULFATE (PF) 2 MG/ML IV SOLN
1.0000 mg | Freq: Once | INTRAVENOUS | Status: DC
  Filled 2017-10-21: qty 1

## 2017-10-21 MED ORDER — POLYETHYLENE GLYCOL 400 0.25 % OP SOLN: Freq: Every day | OPHTHALMIC | Status: DC | PRN

## 2017-10-21 MED ORDER — SODIUM CHLORIDE 0.9 % IV BOLUS (SEPSIS)
1000.0000 mL | Freq: Once | INTRAVENOUS | Status: AC
  Administered 2017-10-21: 1000 mL via INTRAVENOUS

## 2017-10-21 NOTE — H&P (Addendum)
History and Physical    Janet Barnes KGM:[id number] DOB: [birth year] DOA: 10/21/2017  Referring MD/NP/PA: Dr. Steward Ros PCP: Willey Blade, MD  Patient coming from: Direct admit from oncology office  Chief Complaint: Abnormal lab work  I have personally briefly reviewed patient's old medical records in [place]   HPI: Janet Barnes is a 84 y.o. female with medical history significant of colon cancer with metastases on chemotherapy followed by Dr. Burr Medico; who presented after recent abnormal lab work.  Patient had just recently had a port placed on 2/20 by Dr. Dema Severin and had began her first chemotherapy treatment the following day.  Since that time patient reported urinating without any issues.  She does complain of intermittent sharp left upper abdominal pain, but attributes this to her cancer.  She makes note that she did not like taking hydrocodone as it made her constipated, and then MiraLAX if taken too often gave her diarrhea.  Associated symptoms include lower extremity swelling, decreased oral intake of foods,  and increased mucus at the back of her throat that makes it difficult for her to eat.  Denies having any significant fever, chills, shortness of breath, cough, nausea, vomiting, chest pain, dysuria, flank pain, blood in stool/urine, or recent falls.  Labs at her oncologist office had shown a increasing creatinine from 2.26 on 2/21 to 2.77 on 2/25.  Other labs drawn from today included WBC 12.5, hemoglobin 9, sodium 129, potassium 5.6, BUN 66, albumin 2.5, total bilirubin 1.3  ED Course: As seen above  Review of Systems  Constitutional: Positive for malaise/fatigue. Negative for chills and fever.       Positive for decreased p.o. intake  HENT: Positive for congestion. Negative for tinnitus.        Positive for increased mucus production  Eyes: Negative for photophobia and pain.  Respiratory: Negative for cough and shortness of breath.   Cardiovascular:  Positive for leg swelling. Negative for chest pain.  Gastrointestinal: Positive for abdominal pain. Negative for constipation, diarrhea, nausea and vomiting.  Genitourinary: Negative for dysuria and flank pain.  Musculoskeletal: Negative for falls and joint pain.  Skin: Negative for itching and rash.  Neurological: Negative for focal weakness, seizures, loss of consciousness and headaches.  Psychiatric/Behavioral: Negative for substance abuse and suicidal ideas.    Past Medical History:  Diagnosis Date  . Allergy    seasonal  . Anemia    iroin infusion - 07/2017   . Blood transfusion without reported diagnosis    IV Feraheme  . Cancer [facility] 2)    colon cancer   . Chronic kidney disease   . Edema    in feet started on 10/13/17- patient stated she elevated feet and went away, call into PCP- Dr Karlton Lemon   . GERD (gastroesophageal reflux disease)   . Hyperlipidemia   . Hypertension   . Macular degeneration   . Macular degeneration    receives eye injections every 6 months by Dr Ricki Miller at [facility]  . PONV (postoperative nausea and vomiting)   . Retinal break of left eye 2013    Past Surgical History:  Procedure Laterality Date  . ABDOMINAL HYSTERECTOMY  1977  . PORTACATH PLACEMENT N/A 10/16/2017   Procedure: INSERTION PORT-A-CATH;  Surgeon: Ileana Roup, MD;  Location: [facility];  Service: General;  Laterality: N/A;     reports that she has quit smoking. she has never used smokeless tobacco. She reports that she does not drink alcohol or use drugs.  Allergies  Allergen Reactions  . Feraheme [Ferumoxytol] Shortness Of Breath and Other (See Comments)    Dyspnea 30 min after infusion (happened after 2nd infusion only)  . Codeine Nausea Only  . Sulfur Other (See Comments)    Abdominal burning    Family History  Problem Relation Age of Onset  . Colon cancer Mother 93       colon   . Heart disease Father   . Cancer Sister   . Pancreatic cancer Sister 76        pancreatic   . Rectal cancer Neg Hx   . Stomach cancer Neg Hx   . Liver cancer Neg Hx     Prior to Admission medications   Medication Sig Start Date End Date Taking? Authorizing Provider  acetaminophen (TYLENOL) 500 MG tablet Take 500 mg by mouth every 6 (six) hours as needed for moderate pain or headache.    [provider]  allopurinol (ZYLOPRIM) 300 MG tablet Take 1 tablet (300 mg total) by mouth daily. 10/17/17   Alla Feeling, NP  Cholecalciferol (VITAMIN D) 2000 UNITS tablet Take 2,000 Units by mouth daily.    [provider]  Coenzyme Q10 (CO Q 10 PO) Take 1 capsule by mouth daily.    [provider]  fenofibrate 160 MG tablet Take 160 mg by mouth daily.     [provider]  gabapentin (NEURONTIN) 300 MG capsule Take 1 capsule (300 mg total) by mouth 3 (three) times daily. 10/16/17 10/16/18  Ileana Roup, MD  HYDROcodone-acetaminophen (NORCO/VICODIN) 5-325 MG tablet Take 1 tablet by mouth every 6-8 hours as needed for pain Patient taking differently: Take 1 tablet by mouth every 6 (six) hours as needed for moderate pain.  09/18/17   Pyrtle, Lajuan Lines, MD  lidocaine-prilocaine (EMLA) cream Apply to affected area once Patient not taking: Reported on 10/17/2017 10/08/17   Truitt Merle, MD  miconazole (MICOTIN) 2 % powder Apply topically as needed for itching.    [provider]  Nebivolol HCl (BYSTOLIC) 20 MG TABS Take 20 mg by mouth daily.  03/17/12   [provider]  ondansetron (ZOFRAN) 8 MG tablet Take 1 tablet (8 mg total) by mouth 2 (two) times daily as needed for refractory nausea / vomiting. Start on day 3 after chemotherapy. Patient not taking: Reported on 10/17/2017 10/08/17   Truitt Merle, MD  pantoprazole (PROTONIX) 40 MG tablet Take 1 tablet (40 mg total) by mouth daily. 08/28/17   Levin Erp, PA  Polyethylene Glycol 400 (BLINK TEARS OP) Place 1 drop into both eyes daily as needed (for dry eyes).    [provider]  prochlorperazine (COMPAZINE) 10 MG tablet Take 1 tablet (10 mg total) by mouth every 6 (six) hours as needed (Nausea or vomiting). Patient not taking: Reported on 10/17/2017 10/08/17   Truitt Merle, MD  spironolactone (ALDACTONE) 50 MG tablet Take 50 mg by mouth Daily.  02/21/12   [provider]    Physical Exam:  Constitutional: Elderly female who appears to be in NAD, calm, comfortable Vitals:   10/21/17 1831  BP: (!) 129/53  Pulse: 71  Resp: 15  Temp: 98.1 F (36.7 C)  TempSrc: Oral  SpO2: 100%  Weight: 79.4 kg (175 lb 0.7 oz)  Height: 5\' 3"  (1.6 m)   Eyes: PERRL, lids and conjunctivae normal ENMT: Mucous membranes are moist. Posterior pharynx clear of any exudate or lesions.Normal dentition.  Neck: normal, supple, no masses, no thyromegaly  Respiratory: clear to auscultation bilaterally, no wheezing, no crackles. Normal respiratory effort. No accessory muscle use.  Cardiovascular: Regular rate and rhythm, no murmurs / rubs / gallops.  2+ pitting lower extremity edema present. 2+ pedal pulses. No carotid bruits.  Right chest wall Port-A-Cath in place without signs of erythema. Abdomen: no tenderness, no masses palpated. No hepatosplenomegaly. Bowel sounds positive.  Musculoskeletal: no clubbing / cyanosis. No joint deformity upper and lower extremities. Good ROM, no contractures. Normal muscle tone.  Skin: no rashes, lesions, ulcers. No induration Neurologic: CN 2-12 grossly intact. Sensation intact, DTR normal. Strength 5/5 in all 4.  Psychiatric: Normal judgment and insight. Alert and oriented x 3. Normal mood.     Labs on Admission: I have personally reviewed following labs and imaging studies  CBC: Recent Labs  Lab 10/16/17 1030 10/17/17 0942 10/18/17 1141 10/21/17 1005  WBC 13.0* 13.8* 16.9* 12.5*  NEUTROABS  --  12.3* 15.1* 11.7*  HGB 10.1*  --   --   --   HCT 30.7* 31.4* 31.2* 28.1*  MCV 79.3 80.2 80.7 79.6  PLT 392 375 352 614   Basic Metabolic  Panel: Recent Labs  Lab 10/17/17 0942 10/18/17 1141 10/21/17 1005  NA 131* 132* 129*  K 4.5 4.6 5.6*  CL 98 99 99  CO2 20* 21* 20*  GLUCOSE 116 109 122  BUN 40* 45* 66*  CREATININE 2.26* 2.34* 2.77*  CALCIUM 8.7 9.0 8.8   GFR: Estimated Creatinine Clearance: 15.9 mL/min (A) (by C-G formula based on SCr of 2.77 mg/dL (H)). Liver Function Tests: Recent Labs  Lab 10/17/17 0942 10/18/17 1141 10/21/17 1005  AST 32 42* 29  ALT 22 23 23   ALKPHOS 51 83 97  BILITOT 0.4 1.9* 1.3*  PROT 6.2* 6.4 6.0*  ALBUMIN 2.6* 2.7* 2.5*   No results for input(s): LIPASE, AMYLASE in the last 168 hours. No results for input(s): AMMONIA in the last 168 hours. Coagulation Profile: Recent Labs  Lab 10/16/17 1030  INR 1.21   Cardiac Enzymes: No results for input(s): CKTOTAL, CKMB, CKMBINDEX, TROPONINI in the last 168 hours. BNP (last 3 results) No results for input(s): PROBNP in the last 8760 hours. HbA1C: No results for input(s): HGBA1C in the last 72 hours. CBG: No results for input(s): GLUCAP in the last 168 hours. Lipid Profile: No results for input(s): CHOL, HDL, LDLCALC, TRIG, CHOLHDL, LDLDIRECT in the last 72 hours. Thyroid Function Tests: No results for input(s): TSH, T4TOTAL, FREET4, T3FREE, THYROIDAB in the last 72 hours. Anemia Panel: No results for input(s): VITAMINB12, FOLATE, FERRITIN, TIBC, IRON, RETICCTPCT in the last 72 hours. Urine analysis:    Component Value Date/Time   COLORURINE YELLOW 10/17/2017 [place] 10/17/2017 1550   LABSPEC 1.010 10/17/2017 1550   PHURINE 5.0 10/17/2017 1550   GLUCOSEU NEGATIVE 10/17/2017 1550   HGBUR NEGATIVE 10/17/2017 1550   BILIRUBINUR NEGATIVE 10/17/2017 1550   KETONESUR NEGATIVE 10/17/2017 1550   PROTEINUR NEGATIVE 10/17/2017 1550   NITRITE NEGATIVE 10/17/2017 1550   LEUKOCYTESUR TRACE (A) 10/17/2017 1550   Sepsis Labs: Recent Results (from the past 240 hour(s))  Culture, Urine     Status: None   Collection  Time: 10/17/17  3:50 PM  Result Value Ref Range Status   Specimen Description   Final    URINE, CLEAN CATCH Performed at [facility], [place] [address] [place], [place] [address]    Special Requests   Final    NONE Performed at [facility], [place] [address] [place], [place] [address]    Culture   Final    NO GROWTH Performed at [facility], [place] [address] [place], [place] [address]    Report Status 10/18/2017 FINAL  Final     Radiological Exams on Admission: No results found.  EKG: Independently reviewed.  Sinus rhythm at 74 bpm  Assessment/Plan Acute renal failure: Patient's baseline creatinine previously noted to be around 1.2-1.4, but presents with a creatinine of 2.77 with BUN 66 seen prior to admission.  Given elevated BUN to creatinine ratio suspect prerenal cause.  Question the possibility of tumor lysis syndrome as cause initially.  Uric acid levels trending downward from 12.2 on 2/21 down to 3.3 on today's labs. - Admit to a MedSurg bed - Strict intake and output - recheck urinalysis (previous urinalysis negative on 2/21. - Check Urine sodium, creatinine, and urea - Bolus 1 L of normal saline IV fluids, then placed on rate of 125 mL/h as tolerated - Check renal ultrasound - Continue allopurinol  Leukocytosis WBC elevated 12.5 on admission, but appears to be downward trending. - Follow-up urinalysis  - Recheck CBC in a.m.  Increased mucus production: Acute. - Mucinex   Hyperkalemia: Acute.  On admission patient noted to have a  potassium of 5.6.   - Checking  EKG - IV fluids as seen above - Holding spironolactone secondary to AKI and hyperkalemia  Normocytic normochromic  anemia : Hemoglobin 9 on admission.  Baseline hemoglobin appears between 8 and 10.  Iron studies from 2/21 showed signs of iron deficiency. - Recheck hemoglobin in a.m.  Hyponatremia: Sodium 129 on admission.  Suspect secondary to  dehydration - Recheck in a.m.  Essential hypertension - Continue  Nebivolol - Hold Spironolactone   Metastatic colon cancer : Patient status post first chemotherpy treatment on 2/21 currently being followed by Dr. Burr Medico. - Discontinued hydrocodone per patient - Continue gabapentin adjust for renal clearance - Dr. Burr Medico to treatment team   Hypoalbuminemia: Patient's initial albumin 2.5 on admission.  Patient admits to decreased oral intake. - Check prealbumin in a.m. - Boost between meals  Lower extremity edema: Suspect related to hypoalbuminemia - Continue to monitor  Dyslipidemia - Continue fenofibrate  GERD -Continue Protonix   DVT prophylaxis: heparin Code Status:full Family Communication: Discussed plan of care with the patient and family present at bedside Disposition Plan: Discharge home in 2-3 days Consults called: none Admission status: inpatient   Norval Morton MD [facility] Pager [phone]   If 7PM-7AM, please contact night-coverage [email or web address] Password [facility]  10/21/2017, 7:28 PM

## 2017-10-21 NOTE — Progress Notes (Unsigned)
Patient seen by Dr. Burr Medico in infusion room due to abnormal lab results.  Patient to be admitted to hospital d/t elevated BUN and creatinine.  Son aware, at bedside.

## 2017-10-21 NOTE — Patient Instructions (Signed)
Dehydration, Adult Dehydration is when there is not enough fluid or water in your body. This happens when you lose more fluids than you take in. Dehydration can range from mild to very bad. It should be treated right away to keep it from getting very bad. Symptoms of mild dehydration may include:  Thirst.  Dry lips.  Slightly dry mouth.  Dry, warm skin.  Dizziness. Symptoms of moderate dehydration may include:  Very dry mouth.  Muscle cramps.  Dark pee (urine). Pee may be the color of tea.  Your body making less pee.  Your eyes making fewer tears.  Heartbeat that is uneven or faster than normal (palpitations).  Headache.  Light-headedness, especially when you stand up from sitting.  Fainting (syncope). Symptoms of very bad dehydration may include:  Changes in skin, such as: ? Cold and clammy skin. ? Blotchy (mottled) or pale skin. ? Skin that does not quickly return to normal after being lightly pinched and let go (poor skin turgor).  Changes in body fluids, such as: ? Feeling very thirsty. ? Your eyes making fewer tears. ? Not sweating when body temperature is high, such as in hot weather. ? Your body making very little pee.  Changes in vital signs, such as: ? Weak pulse. ? Pulse that is more than 100 beats a minute when you are sitting still. ? Fast breathing. ? Low blood pressure.  Other changes, such as: ? Sunken eyes. ? Cold hands and feet. ? Confusion. ? Lack of energy (lethargy). ? Trouble waking up from sleep. ? Short-term weight loss. ? Unconsciousness. Follow these instructions at home:  If told by your doctor, drink an ORS: ? Make an ORS by using instructions on the package. ? Start by drinking small amounts, about  cup (120 mL) every 5-10 minutes. ? Slowly drink more until you have had the amount that your doctor said to have.  Drink enough clear fluid to keep your pee clear or pale yellow. If you were told to drink an ORS, finish the ORS  first, then start slowly drinking clear fluids. Drink fluids such as: ? Water. Do not drink only water by itself. Doing that can make the salt (sodium) level in your body get too low (hyponatremia). ? Ice chips. ? Fruit juice that you have added water to (diluted). ? Low-calorie sports drinks.  Avoid: ? Alcohol. ? Drinks that have a lot of sugar. These include high-calorie sports drinks, fruit juice that does not have water added, and soda. ? Caffeine. ? Foods that are greasy or have a lot of fat or sugar.  Take over-the-counter and prescription medicines only as told by your doctor.  Do not take salt tablets. Doing that can make the salt level in your body get too high (hypernatremia).  Eat foods that have minerals (electrolytes). Examples include bananas, oranges, potatoes, tomatoes, and spinach.  Keep all follow-up visits as told by your doctor. This is important. Contact a doctor if:  You have belly (abdominal) pain that: ? Gets worse. ? Stays in one area (localizes).  You have a rash.  You have a stiff neck.  You get angry or annoyed more easily than normal (irritability).  You are more sleepy than normal.  You have a harder time waking up than normal.  You feel: ? Weak. ? Dizzy. ? Very thirsty.  You have peed (urinated) only a small amount of very dark pee during 6-8 hours. Get help right away if:  You have symptoms of   very bad dehydration.  You cannot drink fluids without throwing up (vomiting).  Your symptoms get worse with treatment.  You have a fever.  You have a very bad headache.  You are throwing up or having watery poop (diarrhea) and it: ? Gets worse. ? Does not go away.  You have blood or something green (bile) in your throw-up.  You have blood in your poop (stool). This may cause poop to look black and tarry.  You have not peed in 6-8 hours.  You pass out (faint).  Your heart rate when you are sitting still is more than 100 beats a  minute.  You have trouble breathing. This information is not intended to replace advice given to you by your health care provider. Make sure you discuss any questions you have with your health care provider. Document Released: 06/09/2009 Document Revised: 03/02/2016 Document Reviewed: 10/07/2015 Elsevier Interactive Patient Education  2018 [employer]  

## 2017-10-21 NOTE — Progress Notes (Signed)
Pt stated that her son could remain in the room while the questions were asked on the nursing admission hx. Lucius Conn BSN, RN-BC Admissions RN 10/21/2017 7:17 PM

## 2017-10-22 ENCOUNTER — Inpatient Hospital Stay (HOSPITAL_COMMUNITY): Payer: Medicare Other

## 2017-10-22 ENCOUNTER — Encounter: Payer: Self-pay | Admitting: Hematology

## 2017-10-22 ENCOUNTER — Other Ambulatory Visit: Payer: Self-pay

## 2017-10-22 DIAGNOSIS — I10 Essential (primary) hypertension: Secondary | ICD-10-CM

## 2017-10-22 DIAGNOSIS — N1330 Unspecified hydronephrosis: Secondary | ICD-10-CM

## 2017-10-22 DIAGNOSIS — E875 Hyperkalemia: Secondary | ICD-10-CM | POA: Diagnosis present

## 2017-10-22 DIAGNOSIS — D509 Iron deficiency anemia, unspecified: Secondary | ICD-10-CM

## 2017-10-22 DIAGNOSIS — R600 Localized edema: Secondary | ICD-10-CM | POA: Diagnosis present

## 2017-10-22 DIAGNOSIS — K219 Gastro-esophageal reflux disease without esophagitis: Secondary | ICD-10-CM | POA: Diagnosis present

## 2017-10-22 DIAGNOSIS — R599 Enlarged lymph nodes, unspecified: Secondary | ICD-10-CM

## 2017-10-22 DIAGNOSIS — D72829 Elevated white blood cell count, unspecified: Secondary | ICD-10-CM | POA: Diagnosis present

## 2017-10-22 DIAGNOSIS — D6481 Anemia due to antineoplastic chemotherapy: Secondary | ICD-10-CM

## 2017-10-22 DIAGNOSIS — D630 Anemia in neoplastic disease: Secondary | ICD-10-CM

## 2017-10-22 DIAGNOSIS — E883 Tumor lysis syndrome: Secondary | ICD-10-CM

## 2017-10-22 LAB — COMPREHENSIVE METABOLIC PANEL
ALT: 28 U/L (ref 14–54)
ANION GAP: 9 (ref 5–15)
AST: 45 U/L — AB (ref 15–41)
Albumin: 2.2 g/dL — ABNORMAL LOW (ref 3.5–5.0)
Alkaline Phosphatase: 147 U/L — ABNORMAL HIGH (ref 38–126)
BILIRUBIN TOTAL: 1.2 mg/dL (ref 0.3–1.2)
BUN: 56 mg/dL — AB (ref 6–20)
CHLORIDE: 106 mmol/L (ref 101–111)
CO2: 18 mmol/L — ABNORMAL LOW (ref 22–32)
Calcium: 7.8 mg/dL — ABNORMAL LOW (ref 8.9–10.3)
Creatinine, Ser: 2.27 mg/dL — ABNORMAL HIGH (ref 0.44–1.00)
GFR, EST AFRICAN AMERICAN: 22 mL/min — AB (ref >60)
GFR, EST NON AFRICAN AMERICAN: 19 mL/min — AB (ref >60)
Glucose, Bld: 107 mg/dL — ABNORMAL HIGH (ref 65–99)
POTASSIUM: 4.9 mmol/L (ref 3.5–5.1)
Sodium: 133 mmol/L — ABNORMAL LOW (ref 135–145)
TOTAL PROTEIN: 5.4 g/dL — AB (ref 6.5–8.1)

## 2017-10-22 LAB — SODIUM, URINE, RANDOM: SODIUM UR: 97 mmol/L

## 2017-10-22 LAB — CBC
HCT: 25 % — ABNORMAL LOW (ref 36.0–46.0)
Hemoglobin: 8.4 g/dL — ABNORMAL LOW (ref 12.0–15.0)
MCH: 26.5 pg (ref 26.0–34.0)
MCHC: 33.6 g/dL (ref 30.0–36.0)
MCV: 78.9 fL (ref 78.0–100.0)
PLATELETS: 234 10*3/uL (ref 150–400)
RBC: 3.17 MIL/uL — ABNORMAL LOW (ref 3.87–5.11)
RDW: 20 % — AB (ref 11.5–15.5)
WBC: 7.7 10*3/uL (ref 4.0–10.5)

## 2017-10-22 LAB — ABO/RH: ABO/RH(D): AB POS

## 2017-10-22 LAB — CREATININE, URINE, RANDOM: CREATININE, URINE: 54.48 mg/dL

## 2017-10-22 LAB — PREALBUMIN

## 2017-10-22 MED ORDER — SODIUM CHLORIDE 0.9 % IV SOLN
1.0000 g | Freq: Once | INTRAVENOUS | Status: AC
  Administered 2017-10-22: 1 g via INTRAVENOUS
  Filled 2017-10-22: qty 1.1

## 2017-10-22 MED ORDER — ZOLPIDEM TARTRATE 5 MG PO TABS
2.5000 mg | ORAL_TABLET | Freq: Once | ORAL | Status: AC
  Administered 2017-10-23: 2.5 mg via ORAL
  Filled 2017-10-22: qty 1

## 2017-10-22 MED ORDER — BOOST PLUS PO LIQD
237.0000 mL | ORAL | Status: DC
  Filled 2017-10-22 (×3): qty 237

## 2017-10-22 MED ORDER — BOOST / RESOURCE BREEZE PO LIQD CUSTOM: 1.0000 | Freq: Three times a day (TID) | ORAL | Status: DC

## 2017-10-22 MED ORDER — POLYETHYLENE GLYCOL 3350 17 G PO PACK
17.0000 g | PACK | Freq: Every day | ORAL | Status: DC | PRN
Start: 2017-10-22 — End: 2017-10-24
  Administered 2017-10-22: 17 g via ORAL
  Filled 2017-10-22: qty 1

## 2017-10-22 MED ORDER — GABAPENTIN 300 MG PO CAPS
300.0000 mg | ORAL_CAPSULE | Freq: Every day | ORAL | Status: DC
  Administered 2017-10-22 – 2017-10-24 (×3): 300 mg via ORAL
  Filled 2017-10-22 (×3): qty 1

## 2017-10-22 MED ORDER — GUAIFENESIN ER 600 MG PO TB12
600.0000 mg | ORAL_TABLET | Freq: Two times a day (BID) | ORAL | Status: DC
  Administered 2017-10-22 – 2017-10-24 (×3): 600 mg via ORAL
  Filled 2017-10-22 (×5): qty 1

## 2017-10-22 MED ORDER — LIP MEDEX EX OINT
TOPICAL_OINTMENT | CUTANEOUS | Status: AC
  Administered 2017-10-22: 12:00:00
  Filled 2017-10-22: qty 7

## 2017-10-22 NOTE — Progress Notes (Signed)
Janet Barnes   DOB:25-Oct-1933   NT#:[id number]   GQQ#:[id number]  Oncology follow-up note  Subjective: Patient is well-known to me, under my care for her metastatic colon cancer.  She was admitted from my office yesterday for worsening renal function she received first cycle chemo.  Last Thursday, fatigue and anorexia from chemo has improved today.  She has been urinating well, no other new complaints.   Objective:  Vitals:   10/21/17 2054 10/22/17 1300  BP: 114/67 127/60  Pulse: 72 72  Resp: 12 16  Temp: 98.9 F (37.2 C) 97.9 F (36.6 C)  SpO2: 100% 98%    Body mass index is 31.01 kg/m.  Intake/Output Summary (Last 24 hours) at 10/22/2017 1727 Last data filed at 10/22/2017 1025 Gross per 24 hour  Intake 1285.83 ml  Output 600 ml  Net 685.83 ml     Sclerae unicteric  Lungs clear -- no rales or rhonchi  Heart regular rate and rhythm  Abdomen benign  (+) diffuse subcutaneous metastatic nodules  CBG (last 3)  No results for input(s): GLUCAP in the last 72 hours.   Labs:  Lab Results  Component Value Date   WBC 7.7 10/22/2017   HGB 8.4 (L) 10/22/2017   HCT 25.0 (L) 10/22/2017   MCV 78.9 10/22/2017   PLT 234 10/22/2017   NEUTROABS 11.7 (H) 10/21/2017   CMP Latest Ref Rng & Units 10/22/2017 10/21/2017 10/21/2017  Glucose 65 - 99 mg/dL 107(H) 108(H) 122  BUN 6 - 20 mg/dL 56(H) 61(H) 66(H)  Creatinine 0.44 - 1.00 mg/dL 2.27(H) 2.44(H) 2.77(H)  Sodium 135 - 145 mmol/L 133(L) 132(L) 129(L)  Potassium 3.5 - 5.1 mmol/L 4.9 4.9 5.6(H)  Chloride 101 - 111 mmol/L 106 104 99  CO2 22 - 32 mmol/L 18(L) 19(L) 20(L)  Calcium 8.9 - 10.3 mg/dL 7.8(L) 7.8(L) 8.8  Total Protein 6.5 - 8.1 g/dL 5.4(L) - 6.0(L)  Total Bilirubin 0.3 - 1.2 mg/dL 1.2 - 1.3(H)  Alkaline Phos 38 - 126 U/L 147(H) - 97  AST 15 - 41 U/L 45(H) - 29  ALT 14 - 54 U/L 28 - 23     Urine Studies No results for input(s): UHGB, CRYS in the last 72 hours.  Invalid input(s): UACOL, UAPR, USPG, UPH, UTP, UGL,  UKET, UBIL, UNIT, UROB, [place], UEPI, UWBC, [place], [place]  Basic Metabolic Panel: Recent Labs  Lab 10/17/17 6712 10/18/17 1141 10/21/17 1005 10/21/17 2118 10/22/17 0437  NA 131* 132* 129* 132* 133*  K 4.5 4.6 5.6* 4.9 4.9  CL 98 99 99 104 106  CO2 20* 21* 20* 19* 18*  GLUCOSE 116 109 122 108* 107*  BUN 40* 45* 66* 61* 56*  CREATININE 2.26* 2.34* 2.77* 2.44* 2.27*  CALCIUM 8.7 9.0 8.8 7.8* 7.8*  PHOS  --   --   --  4.0  --    GFR Estimated Creatinine Clearance: 19.4 mL/min (A) (by C-G formula based on SCr of 2.27 mg/dL (H)). Liver Function Tests: Recent Labs  Lab 10/17/17 0942 10/18/17 1141 10/21/17 1005 10/22/17 0437  AST 32 42* 29 45*  ALT 22 23 23 28   ALKPHOS 51 83 97 147*  BILITOT 0.4 1.9* 1.3* 1.2  PROT 6.2* 6.4 6.0* 5.4*  ALBUMIN 2.6* 2.7* 2.5* 2.2*   No results for input(s): LIPASE, AMYLASE in the last 168 hours. No results for input(s): AMMONIA in the last 168 hours. Coagulation profile Recent Labs  Lab 10/16/17 1030  INR 1.21    CBC:  Recent Labs  Lab 10/16/17 1030 10/17/17 0942 10/18/17 1141 10/21/17 1005 2017/11/02 0437  WBC 13.0* 13.8* 16.9* 12.5* 7.7  NEUTROABS  --  12.3* 15.1* 11.7*  --   HGB 10.1*  --   --   --  8.4*  HCT 30.7* 31.4* 31.2* 28.1* 25.0*  MCV 79.3 80.2 80.7 79.6 78.9  PLT 392 375 352 244 234   Cardiac Enzymes: No results for input(s): CKTOTAL, CKMB, CKMBINDEX, TROPONINI in the last 168 hours. BNP: Invalid input(s): POCBNP CBG: No results for input(s): GLUCAP in the last 168 hours. D-Dimer No results for input(s): DDIMER in the last 72 hours. Hgb A1c No results for input(s): HGBA1C in the last 72 hours. Lipid Profile No results for input(s): CHOL, HDL, LDLCALC, TRIG, CHOLHDL, LDLDIRECT in the last 72 hours. Thyroid function studies No results for input(s): TSH, T4TOTAL, T3FREE, THYROIDAB in the last 72 hours.  Invalid input(s): FREET3 Anemia work up No results for input(s): VITAMINB12, FOLATE, FERRITIN,  TIBC, IRON, RETICCTPCT in the last 72 hours. Microbiology Recent Results (from the past 240 hour(s))  Culture, Urine     Status: None   Collection Time: 10/17/17  3:50 PM  Result Value Ref Range Status   Specimen Description   Final    URINE, CLEAN CATCH Performed at [facility], [address], [place], [place] [address]    Special Requests   Final    NONE Performed at [facility], [place] [address], [place], [place] [address]    Culture   Final    NO GROWTH Performed at [facility], [place] [address], [place], [place] [address]    Report Status 10/18/2017 FINAL  Final      Studies:  US Renal  Result Date: November 02, 2017 CLINICAL DATA:  Acute renal failure. EXAM: RENAL / URINARY TRACT ULTRASOUND COMPLETE COMPARISON:  No prior. FINDINGS: Right Kidney: Length: 10.4 cm. Mild to moderate right hydronephrosis. No focal mass lesion. Echogenicity normal. Left Kidney: Length: 8.7 cm. Mild left hydronephrosis. No focal mass lesion. Echogenicity normal. Bladder: Appears normal for degree of bladder distention. Mild ascites. IMPRESSION: 1. Mild to moderate right hydronephrosis. Mild left hydronephrosis. No bladder distention. 2.  Mild ascites. Electronically Signed   By: Marcello Moores  Register   On: Nov 02, 2017 12:35   Dg Chest Port 1 View  Result Date: 11/02/2017 CLINICAL DATA:  Congestion EXAM: PORTABLE CHEST 1 VIEW COMPARISON:  October 16, 2017 FINDINGS: Port-A-Cath tip is at the cavoatrial junction. No pneumothorax. There is a small left pleural effusion with mild left base atelectasis. Lungs elsewhere are clear. Heart is mildly enlarged with pulmonary vascularity within normal limits. No adenopathy. There is aortic atherosclerosis. There is degenerative change in each shoulder. IMPRESSION: Small left pleural effusion with left base atelectasis. Lungs elsewhere clear. Stable cardiac prominence. There is aortic atherosclerosis. Port-A-Cath tip is at  the cavoatrial junction. No pneumothorax. Aortic Atherosclerosis (ICD10-I70.0). Electronically Signed   By: Lowella Grip III M.D.   On: 2017-11-02 07:24    Assessment: 84 y.o. with recently diagnosed metastatic colon cancer to lymph nodes, and subcutaneous soft tissue.  1. AKI secondary to tumor lysis syndrome and hydronephrosis 2. B/l hydronephrosis, R>L, possible secondary to retroperitoneal adenopathy 3.  Hyperuricemia, resolved 4.  Hyperkalemia, resolved 5.  Hyponatremia, improved 6.  Normocytic anemia, secondary to iron deficiency, chemo and malignancy 7. HTN  8.  Metastatic colon cancer, recently started chemotherapy on 10/17/17  Plan:  -Today's renal ultrasound showed bilateral hydronephrosis,  right more than left.  I think this is a possible related to her retroperitoneal adenopathy.  Please consult urology to see if she would need ureteral stenting. If she does, hopefully she will have the procedure done this week (OK outpt if can be scheduled) -continue IVF and supportive care -I will cancel her appointment with me this Thursday  -OK to discharge if her renal function improves adequately and she is able to drink more     Truitt Merle, MD 10/22/2017  5:27 PM

## 2017-10-22 NOTE — Progress Notes (Signed)
Initial Nutrition Assessment  DOCUMENTATION CODES:   Obesity unspecified  INTERVENTION:   Boost Plus chocolate once daily- Each supplement provides 360kcal and 14g protein.    NUTRITION DIAGNOSIS:   Increased nutrient needs related to cancer and cancer related treatments as evidenced by estimated needs.  GOAL:   Patient will meet greater than or equal to 90% of their needs  MONITOR:   PO intake, Supplement acceptance, Labs, Weight trends  REASON FOR ASSESSMENT:   Consult Assessment of nutrition requirement/status  ASSESSMENT:   Pt with PMH significant for colon cancer with metastases on chemotherapy, HTN, and CKD III. Presents this admission with acute renal failur on CKD and increased mucus production.    Spoke with pt at bedside. Reports having slight decrease in oral intake related to increased mucus production in the back of her throat. Pt states she was able to consume three meals per day with boost shakes between but it became harder in the days leading up to admission. Pt tolerated boiled eggs and potatoes this morning. Denies any issues with mucus production/swallowing difficulty today. Discussed the importance of protein intake for preservation of lean body mass with chemotherapy treatments moving forward. Pt amendable to Boost every other day while in the hospital.  Pt endorses a UBW of 175 lb and a 3 lb wt loss within a months time frame. Records indicate Pt has maintained her weight of 175 lb to 180 lb since Jan 2018. Nutrition-Focused physical exam completed.   Medications reviewed and include: NS @ 125 ml/hr Labs reviewed: Na 133 (L) BUN 56 (H) Creatinine 2.27 (H) AST 45 (H)   NUTRITION - FOCUSED PHYSICAL EXAM:    Most Recent Value  Orbital Region  No depletion  Upper Arm Region  No depletion  Thoracic and Lumbar Region  No depletion  Buccal Region  Mild depletion  Temple Region  Moderate depletion  Clavicle Bone Region  Mild depletion  Clavicle and  Acromion Bone Region  No depletion  Scapular Bone Region  No depletion  Dorsal Hand  No depletion  Patellar Region  No depletion  Anterior Thigh Region  No depletion  Posterior Calf Region  No depletion  Edema (RD Assessment)  Unable to assess     Diet Order:  Diet Heart Room service appropriate? Yes; Fluid consistency: Thin  EDUCATION NEEDS:   Education needs have been addressed  Skin:  Skin Assessment: Reviewed RN Assessment  Last BM:  10/21/17  Height:   Ht Readings from Last 1 Encounters:  10/21/17 5\' 3"  (1.6 m)    Weight:   Wt Readings from Last 1 Encounters:  10/21/17 175 lb 0.7 oz (79.4 kg)    Ideal Body Weight:  52.3 kg  BMI:  Body mass index is 31.01 kg/m.  Estimated Nutritional Needs:   Kcal:  1600-1800 kcal/day  Protein:  80-90 g/day  Fluid:  >1.6 L/day    Mariana Single RD, LDN Clinical Nutrition Pager # [phone]

## 2017-10-22 NOTE — Progress Notes (Signed)
PROGRESS NOTE    Janet Barnes  NWG:[id number] DOB: [birth year] DOA: 10/21/2017 PCP: Willey Blade, MD     Brief Narrative:  Janet Barnes is a 84 y.o. female with medical history significant of colon cancer with metastases on chemotherapy followed by Dr. Burr Medico; who presented after recent abnormal lab work.  Patient had just recently had a port placed on 2/20 by Dr. Dema Severin and had began her first chemotherapy treatment the following day.  Since that time patient reported urinating without any issues.  She does complain of intermittent sharp left upper abdominal pain, but attributes this to her cancer.  She makes note that she did not like taking hydrocodone as it made her constipated, and then MiraLAX if taken too often gave her diarrhea.  Associated symptoms include lower extremity swelling, decreased oral intake of foods,  and increased mucus at the back of her throat that makes it difficult for her to eat.  At Dr. Ernestina Penna office, she was found to have AKI and was admitted to [facility] for work up and treatment.   Assessment & Plan:   Principal Problem:   ARF (acute renal failure) (HCC) Active Problems:   Malignant neoplasm of ascending colon (HCC)   Leukocytosis   Hyperkalemia   Lower extremity edema   GERD (gastroesophageal reflux disease)   Acute kidney injury on CKD stage 3  -Patient's baseline creatinine previously noted to be around 1.2-1.4 -FeNa 3.29% consistent with intrarenal pathology -IVF -Improving slowly, trend BMP -Renal US pending   Normocytic normochromic  anemia -Baseline hemoglobin appears between 8 and 10 -Stable   Hyponatremia -Improving with IVF   Essential hypertension -Continue Nebivolol -Hold Spironolactone in setting of AKI   Metastatic colon cancer  -Patient status post first chemotherpy treatment on 2/21 currently being followed by Dr. Burr Medico -Continue allopurinol    Hypoalbuminemia -Dietitian consult    Dyslipidemia -Continue fenofibrate  GERD -Continue Protonix     DVT prophylaxis: subq hep Code Status: full Family Communication: at bedside Disposition Plan: pending improvement   Consultants:   Oncology/Dr. Burr Medico  Procedures:   None   Antimicrobials:  Anti-infectives (From admission, onward)   None        Subjective: Patient feeling well this morning. Just finished walking around the hallway with daughter. She has no complaints of pain, shortness of breath. Does admit to increased mucus production and has not had much appetite.   Objective: Vitals:   10/21/17 1831 10/21/17 2054  BP: (!) 129/53 114/67  Pulse: 71 72  Resp: 15 12  Temp: 98.1 F (36.7 C) 98.9 F (37.2 C)  TempSrc: Oral Oral  SpO2: 100% 100%  Weight: 79.4 kg (175 lb 0.7 oz)   Height: 5\' 3"  (1.6 m)     Intake/Output Summary (Last 24 hours) at 10/22/2017 1209 Last data filed at 10/22/2017 1025 Gross per 24 hour  Intake 1045.83 ml  Output 600 ml  Net 445.83 ml   Filed Weights   10/21/17 1831  Weight: 79.4 kg (175 lb 0.7 oz)    Examination:  General exam: Appears calm and comfortable  Respiratory system: Clear to auscultation. Respiratory effort normal. Cardiovascular system: S1 & S2 heard, RRR. No JVD, murmurs, rubs, gallops or clicks. +trace to +1 pedal edema. Gastrointestinal system: Abdomen is nondistended, soft and nontender. No organomegaly or masses felt. Normal bowel sounds heard. Central nervous system: Alert and oriented. No focal neurological deficits. Extremities: Symmetric 5 x 5 power. Skin: No rashes, lesions or ulcers Psychiatry:  Judgement and insight appear normal. Mood & affect appropriate.   Data Reviewed: I have personally reviewed following labs and imaging studies  CBC: Recent Labs  Lab 10/16/17 1030 10/17/17 0942 10/18/17 1141 10/21/17 1005 10/22/17 0437  WBC 13.0* 13.8* 16.9* 12.5* 7.7  NEUTROABS  --  12.3* 15.1* 11.7*  --   HGB 10.1*  --   --   --   8.4*  HCT 30.7* 31.4* 31.2* 28.1* 25.0*  MCV 79.3 80.2 80.7 79.6 78.9  PLT 392 375 352 244 678   Basic Metabolic Panel: Recent Labs  Lab 10/17/17 0942 10/18/17 1141 10/21/17 1005 10/21/17 2118 10/22/17 0437  NA 131* 132* 129* 132* 133*  K 4.5 4.6 5.6* 4.9 4.9  CL 98 99 99 104 106  CO2 20* 21* 20* 19* 18*  GLUCOSE 116 109 122 108* 107*  BUN 40* 45* 66* 61* 56*  CREATININE 2.26* 2.34* 2.77* 2.44* 2.27*  CALCIUM 8.7 9.0 8.8 7.8* 7.8*  PHOS  --   --   --  4.0  --    GFR: Estimated Creatinine Clearance: 19.4 mL/min (A) (by C-G formula based on SCr of 2.27 mg/dL (H)). Liver Function Tests: Recent Labs  Lab 10/17/17 0942 10/18/17 1141 10/21/17 1005 10/22/17 0437  AST 32 42* 29 45*  ALT 22 23 23 28   ALKPHOS 51 83 97 147*  BILITOT 0.4 1.9* 1.3* 1.2  PROT 6.2* 6.4 6.0* 5.4*  ALBUMIN 2.6* 2.7* 2.5* 2.2*   No results for input(s): LIPASE, AMYLASE in the last 168 hours. No results for input(s): AMMONIA in the last 168 hours. Coagulation Profile: Recent Labs  Lab 10/16/17 1030  INR 1.21   Cardiac Enzymes: No results for input(s): CKTOTAL, CKMB, CKMBINDEX, TROPONINI in the last 168 hours. BNP (last 3 results) No results for input(s): PROBNP in the last 8760 hours. HbA1C: No results for input(s): HGBA1C in the last 72 hours. CBG: No results for input(s): GLUCAP in the last 168 hours. Lipid Profile: No results for input(s): CHOL, HDL, LDLCALC, TRIG, CHOLHDL, LDLDIRECT in the last 72 hours. Thyroid Function Tests: No results for input(s): TSH, T4TOTAL, FREET4, T3FREE, THYROIDAB in the last 72 hours. Anemia Panel: No results for input(s): VITAMINB12, FOLATE, FERRITIN, TIBC, IRON, RETICCTPCT in the last 72 hours. Sepsis Labs: No results for input(s): PROCALCITON, LATICACIDVEN in the last 168 hours.  Recent Results (from the past 240 hour(s))  Culture, Urine     Status: None   Collection Time: 10/17/17  3:50 PM  Result Value Ref Range Status   Specimen Description    Final    URINE, CLEAN CATCH Performed at [facility], [address], [place], [place] [address]    Special Requests   Final    NONE Performed at [facility], [place] [address], [place], [place] [address]    Culture   Final    NO GROWTH Performed at [facility], [place] [address], [place], [place] [address]    Report Status 10/18/2017 FINAL  Final       Radiology Studies: Dg Chest Port 1 View  Result Date: 10/22/2017 CLINICAL DATA:  Congestion EXAM: PORTABLE CHEST 1 VIEW COMPARISON:  October 16, 2017 FINDINGS: Port-A-Cath tip is at the cavoatrial junction. No pneumothorax. There is a small left pleural effusion with mild left base atelectasis. Lungs elsewhere are clear. Heart is mildly enlarged with pulmonary vascularity within normal limits. No adenopathy. There is aortic atherosclerosis. There is degenerative change in each  shoulder. IMPRESSION: Small left pleural effusion with left base atelectasis. Lungs elsewhere clear. Stable cardiac prominence. There is aortic atherosclerosis. Port-A-Cath tip is at the cavoatrial junction. No pneumothorax. Aortic Atherosclerosis (ICD10-I70.0). Electronically Signed   By: Lowella Grip III M.D.   On: 10/22/2017 07:24      Scheduled Meds: . allopurinol  300 mg Oral Daily  . feeding supplement  1 Container Oral TID BM  . fenofibrate  160 mg Oral Daily  . gabapentin  300 mg Oral Daily  . guaiFENesin  600 mg Oral BID  . heparin  5,000 Units Subcutaneous Q8H  . nebivolol  20 mg Oral Daily  . pantoprazole  40 mg Oral Daily   Continuous Infusions: . sodium chloride 125 mL/hr at 10/22/17 0438     LOS: 1 day    Time spent: 40 minutes   Dessa Phi, DO [facility] [email or web address] Password [facility] 10/22/2017, 12:09 PM

## 2017-10-22 NOTE — Progress Notes (Signed)
[facility]  [phone] [phone]  Clinic Follow up Note   Patient Care Team: Willey Blade, MD as PCP - General (Internal Medicine) Ileana Roup, MD as Consulting Physician (General Surgery) Truitt Merle, MD as Consulting Physician (Hematology) 10/21/2017  CHIEF COMPLAINS: f/u metastatic colon cancer   SUMMARY OF ONCOLOGIC HISTORY: Oncology History   Cancer Staging Malignant neoplasm of ascending colon [facility]) Staging form: Colon and Rectum, AJCC 8th Edition - Clinical stage from 09/16/2017: Stage IVB (cTX, cNX, cM1b) - Signed by Truitt Merle, MD on 10/22/2017       Malignant neoplasm of ascending colon ([place])   09/16/2017 Procedure    Colonoscopy per Dr. Hilarie Fredrickson - The digital rectal exam was normal. - A fungating and ulcerated partially obstructing large mass was found in the distal ascending colon. The mass was circumferential. Oozing was present. The pediatric colonoscopy would not tranverse this lesion. This was biopsied with a cold forceps for histology. Area distal to the mass was tattooed with injections of total 5 mL of Spot (carbon black). - Multiple small and large-mouthed diverticula were found in the sigmoid colon, descending colon and transverse colon. - Internal hemorrhoids were found during retroflexion. The hemorrhoids were small.  IMPRESSION - Malignant partially obstructing tumor in the distal ascending colon. Biopsied. Tattooed. - Moderate diverticulosis in the sigmoid colon, in the descending colon and in the transverse colon. - Internal hemorrhoids.      09/16/2017 Procedure    EGD IMPRESSION - Normal esophagus. - Erythematous mucosa in the antrum. Biopsied. - Normal examined duodenum. Biopsied.        09/17/2017 Imaging    CT CAP IMPRESSION: 1. Circumferential mass of the ascending colon extending over a 5.7 cm length associated with prominent luminal narrowing and surrounding edema as well as  surrounding tumor nodularity. Adenopathy in the lower neck, chest, upper abdomen, retroperitoneum, and pelvis with scattered subcutaneous nodules in the chest, abdomen, and pelvis; a suspected intramuscular metastatic lesion along the right gluteus maximus; and a few soft tissue nodules in the omentum. This is an unusual pattern for metastatic spread of colon adenocarcinoma, and while I cannot be certain that the subcutaneous nodules represent active tumor, the unusual distribution does raise the possibility of melanoma or lymphoma with colon involvement, or some combination of malignancies. Small amount of ascites in the right paracolic gutter and pelvis. 2. Hypodensity along the falciform ligament is probably from focal fatty infiltration. No definite hepatic metastatic lesion is seen. 3. Other imaging findings of potential clinical significance: Aortic Atherosclerosis (ICD10-I70.0). Mild airway plugging in the right lower lobe. Trace right pleural effusion. Descending and sigmoid colon diverticulosis.       09/25/2017 Initial Diagnosis    Malignant neoplasm of ascending colon ([place])      09/26/2017 Pathology Results    Diagnosis Soft tissue, biopsy, anterior rt chest palpable subcutaneous nodule - POORLY DIFFERENTIATED CARCINOMA. Microscopic Comment Immunohistochemistry is performed and the tumor is positive with MOC-31, focally positive with CDX-2 and shows weak positivity with estrogen receptor. The tumor is negative with cytokeratin 7, cytokeratin 20, GATA-3, gross cystic disease fluid protein, progesterone receptor, Napsin A, thyroid transcription factor-1 and WT-1. The immunophenotype is consistent with adenocarcinoma, and the weak estrogen receptor positivity suggests breast or gynecologic primaries. Clinical correlation is essential.      10/17/2017 -  Chemotherapy    First line mFOLFOX, oxaliplatin was omitted for first cycle due to tumor lysis syndrome  HISTORY OF PRESENT ILLNESS (09/24/2017) Janet Barnes 84 y.o. female is here because of newly diagnosed cancer of the ascending colon. She was in her usual state of health until she developed bronchitis late December 2018. She was prescribed cough syrup but only took 2 doses because she noted decreased appetite and fatigue after taking it. Her bronchitis resolved but remained fatigued. She was due for her previously scheduled colonoscopy in January 2019 and developed stabbing left lower abdomina pain after eating with intermittent nausea and diarrhea. Labs performed at GI with significant anemia with low iron studies. Colonoscopy found a fungating and ulcerated partially obstructing large mass in the distal ascending colon which was circumferential. EGD with normal esophagus and duodenum but with erythematous mucosa in the gastric antrum which was biopsied. Staging CT CAP revealed cirfumferential mass in the ascending colon extending over a 5.7 cm length associated with prominent luminal narrowing and surrounding edema as well as surrounding tumor nodularity. Also notable for adenopathy in the lower neck, chest, upper abdomen, retroperitoneum and pelvis with scattered subcutaneous nodules in the chest, abdomen, and pelvis; there is suspected intramuscular metastatic lesion along the right gluteus maximus.   Past medical history is significant for anemia but has not previously been prescribed oral iron replacement and never received blood transfusion. She has history of GERD but had not been taking PPI lately, she has since restarted pantoprazole. Positive for HTN, kidney disease, hyperlipidemia, macular degeneration, and retinal detachment. She has had hysterectomy but no other abdominal surgeries. Family history is strongly positive for colon cancer in her mother, pancreatic cancer in her sister. She has two children who are alive and healthy. She continues to be active in her community with church  work and goes to the gym 4 times per week; she drives her self, lives alone, and is independent of all ADLs. Her children live out of town but are here for consult today. Daughter has ample time off with work if she should need more care during course of treatment.   Today she feels well overall. Denies abdominal pain at this time as it occurs mostly at night when she lies down. Does not want to take norco due to hydrocodone component. Reports 3 recent episodes of blood in stool without frank red blood per rectum. She received 2 doses of IV feraheme on 08/29/17 and 09/06/17, tolerated the first infusion but developed dyspnea, itching, nausea, and multiple subcutaneous skin nodules later in the day after second Feraheme infusion.  Denies vomiting, dysphagia, or weight loss. She is most concerned about multiple nontender skin nodules that appeared the day of her 2nd Feraheme infusion, one on her chin appears to be enlarging.  CURRENT THERAPY:  first line chemo mFOLFOX and Avastin, started on 10/17/2017  INTERVAL HISTORY: Pt returns for lab and IVF. She is accompanied by her son to the infusion room today.  I saw him in the infusion room.  She has been feeling quite fatigued since her chemo last week, eating less, able to drink fluids adequately.  Mild nausea, no vomiting.  No fever or chills, she urinates adequately.  No other new complaints.  REVIEW OF SYSTEMS:   Constitutional: Denies fevers, chills or abnormal weight loss, (+) fatigue and weight loss  Eyes: Denies blurriness of vision Ears, nose, mouth, throat, and face: Denies mucositis or sore throat Respiratory: Denies cough, dyspnea or wheezes Cardiovascular: Denies palpitation, chest discomfort or lower extremity swelling Gastrointestinal:  Denies nausea, heartburn or change in bowel habits Skin: (+)  diffuse subcutaneous nodules  Lymphatics: Denies new lymphadenopathy or easy bruising Neurological:Denies numbness, tingling or new  weaknesses Behavioral/Psych: Mood is stable, no new changes  All other systems were reviewed with the patient and are negative.  MEDICAL HISTORY:  Past Medical History:  Diagnosis Date  . Allergy    seasonal  . Anemia    iroin infusion - 07/2017   . Blood transfusion without reported diagnosis    IV Feraheme  . Cancer [facility])    colon cancer   . Chronic kidney disease   . Edema    in feet started on 10/13/17- patient stated she elevated feet and went away, call into PCP- Dr Karlton Lemon   . GERD (gastroesophageal reflux disease)   . Hyperlipidemia   . Hypertension   . Macular degeneration   . Macular degeneration    receives eye injections every 6 months by Dr Ricki Miller at [facility]  . PONV (postoperative nausea and vomiting)   . Retinal break of left eye 2013    SURGICAL HISTORY: Past Surgical History:  Procedure Laterality Date  . ABDOMINAL HYSTERECTOMY  1977  . PORTACATH PLACEMENT N/A 10/16/2017   Procedure: INSERTION PORT-A-CATH;  Surgeon: Ileana Roup, MD;  Location: [facility] ORS;  Service: General;  Laterality: N/A;    I have reviewed the social history and family history with the patient and they are unchanged from previous note.  ALLERGIES:  is allergic to feraheme [ferumoxytol]; codeine; and sulfur.  MEDICATIONS:  No current facility-administered medications for this visit.    No current outpatient medications on file.   Facility-Administered Medications Ordered in Other Visits  Medication Dose Route Frequency Provider Last Rate Last Dose  . 0.9 %  sodium chloride infusion   Intravenous Continuous Fuller Plan A, MD 125 mL/hr at 10/22/17 0438    . acetaminophen (TYLENOL) tablet 650 mg  650 mg Oral Q6H PRN Fuller Plan A, MD   650 mg at 10/22/17 6759   Or  . acetaminophen (TYLENOL) suppository 650 mg  650 mg Rectal Q6H PRN Fuller Plan A, MD      . albuterol (PROVENTIL) (2.5 MG/3ML) 0.083% nebulizer solution 2.5 mg  2.5 mg Nebulization Q6H PRN Tamala Julian, Rondell  A, MD      . allopurinol (ZYLOPRIM) tablet 300 mg  300 mg Oral Daily Tamala Julian, Rondell A, MD   300 mg at 10/21/17 2234  . alteplase (CATHFLO ACTIVASE) injection 2 mg  2 mg Intracatheter Once PRN Truitt Merle, MD      . feeding supplement (BOOST / RESOURCE BREEZE) liquid 1 Container  1 Container Oral TID BM Smith, Rondell A, MD      . fenofibrate tablet 160 mg  160 mg Oral Daily Tamala Julian, Rondell A, MD   160 mg at 10/21/17 2234  . gabapentin (NEURONTIN) capsule 300 mg  300 mg Oral Daily Smith, Rondell A, MD      . guaiFENesin (MUCINEX) 12 hr tablet 600 mg  600 mg Oral BID Smith, Rondell A, MD      . heparin injection 5,000 Units  5,000 Units Subcutaneous Q8H Smith, Rondell A, MD   5,000 Units at 10/22/17 0537  . heparin lock flush 100 unit/mL  500 Units Intracatheter Once PRN Truitt Merle, MD      . heparin lock flush 100 unit/mL  500 Units Intracatheter Once PRN Alla Feeling, NP      . nebivolol (BYSTOLIC) tablet 20 mg  20 mg Oral Daily Norval Morton, MD      .  ondansetron (ZOFRAN) tablet 4 mg  4 mg Oral Q6H PRN Fuller Plan A, MD       Or  . ondansetron (ZOFRAN) injection 4 mg  4 mg Intravenous Q6H PRN Smith, Rondell A, MD      . pantoprazole (PROTONIX) EC tablet 40 mg  40 mg Oral Daily Smith, Rondell A, MD      . polyethylene glycol (MIRALAX / GLYCOLAX) packet 17 g  17 g Oral Daily PRN Fuller Plan A, MD   17 g at 10/22/17 0829  . polyvinyl alcohol (LIQUIFILM TEARS) 1.4 % ophthalmic solution 1 drop  1 drop Both Eyes Daily PRN Smith, Rondell A, MD      . sodium chloride flush (NS) 0.9 % injection 10 mL  10 mL Intracatheter PRN Alla Feeling, NP        PHYSICAL EXAMINATION: ECOG PERFORMANCE STATUS: 1 - Symptomatic but completely ambulatory Blood pressure 114/67, pulse 72, respiratory rate 12, temperature 37.2, pulse ox 100% on room air GENERAL:alert, no distress and comfortable SKIN: skin color, texture, turgor are normal, no rashes, diffuse 1-2cm subcutaneous nodes at her left cheek,  submandibular,  neck, UE and trunk with mild skin redness (possible skin invasion) but no open wounds  EYES: normal, Conjunctiva are pink and non-injected, sclera clear OROPHARYNX:no exudate, no erythema and lips, buccal mucosa, and tongue normal  NECK: supple, thyroid normal size, non-tender, without nodularity LYMPH:  (+) palpable cervical, supraclavicular, and axillary lymphadenopathy 1-2cm  LUNGS: clear to auscultation and percussion with normal breathing effort HEART: regular rate & rhythm and no murmurs and no lower extremity edema ABDOMEN:abdomen soft, non-tender and normal bowel sounds Musculoskeletal:no cyanosis of digits and no clubbing  NEURO: alert & oriented x 3 with fluent speech, no focal motor/sensory deficits  LABORATORY DATA:  I have reviewed the data as listed CBC Latest Ref Rng & Units 10/21/2017 10/18/2017  WBC 4.0 - 10.5 K/uL 12.5(H) 16.9(H)  Hemoglobin 12.0 - 15.0 g/dL - -  Hematocrit 36.0 - 46.0 % 28.1(L) 31.2(L)  Platelets 150 - 400 K/uL 244 352     CMP Latest Ref Rng & Units 10/21/2017  Glucose 65 - 99 mg/dL 122  BUN 6 - 20 mg/dL 66(H)  Creatinine 0.44 - 1.00 mg/dL 2.77(H)  Sodium 135 - 145 mmol/L 129(L)  Potassium 3.5 - 5.1 mmol/L 5.6(H)  Chloride 101 - 111 mmol/L 99  CO2 22 - 32 mmol/L 20(L)  Calcium 8.9 - 10.3 mg/dL 8.8  Total Protein 6.5 - 8.1 g/dL 6.0(L)  Total Bilirubin 0.3 - 1.2 mg/dL 1.3(H)  Alkaline Phos 38 - 126 U/L 97  AST 15 - 41 U/L 29  ALT 14 - 54 U/L 23      RADIOGRAPHIC STUDIES: I have personally reviewed the radiological images as listed and agreed with the findings in the report.  No new images    ASSESSMENT & PLAN:  Janet Barnes is a pleasant 84 y.o. AA female with history of iron deficiency anemia and GERD now with colon mass, diffuse subcutaneous nodule and adenopathy   1. AKI and tumor lysis syndrome  -She was found to have acute renal failure with creatinine 2.26 when she came in for first cycle chemo last week, uric  acid was 12, consistent with tumor lysis syndrome.  She received rasburicase, hyperuricemia is resolved now.  She is on allopurinol for prophylaxis. -She received IV fluids twice last week, and today, however her creatinine has been slowly trending up, 2.77 today.  She also has developed  hypocalcemia, mild hyponatremia, I am concerned her tumor lysis syndrome is getting worse since we started chemo last week. -Also may need renal ultrasound to rule out obstruction or other etiology  -I recommend hospital admission to [facility] for further workup and treatment.  She agrees.  2. Adenocarcinoma of ascending colon, with diffuse nodes and subcuetaneous metastasis, cTxNxM1b, stage IVB -We reviewed her records, imaging, and pathology in detail.  -She presents with multiple subcutaneous skin nodules and adenopathy on CT; no evidence of metastasis in the liver -Her subcutaneous nodule biopsy showed poorly differentiated carcinoma. I have discussed with pathologist Dr. Saralyn Pilar who compared the biopsy with her initial colon mass biopsy, which showed very similar morphology.  This is consistent with metastatic colon cancer.  I have discussed this with patient and her son in details. -Unfortunately, her disease will likely not be curable at that point but treatable, she is elderly but in good physical condition and would likely be a good candidate for systemic chemotherapy.  -I recommend first-line chemotherapy with mFOLFOX, every 2 weeks, the goal of therapy is palliative, to prolong her life and hopefully improve her quality of life. -she has started first line chemotherapy FOLFOX last week, due to her tumor lysis syndrome and acute renal failure, oxaliplatin was held. -Her metastatic colon cancer appears to be very aggressive clinically, I suspect that she may have BRAF mutation.  -I have requested [facility] test on her subcutaneous biopsy tumor tissue, to see if she is a candidate for targeted therapy or  immunotherapy, I spoke with path today  -She has moderate fatigue and low appetite, secondary to chemo, will continue supportive care.  2. Anemia, Iron deficiency, and anemia of chronic disease  -She reports a history of anemia, I do not have distant records. -Recent CBC at GI office with Hgb 8.5, normal MCV; Iron 18, saturation 4%; normal but low ferritin  -S/p 2 infusions IV Feraheme 1/3 and 1/11; she had acute shortness of breath, itching, nausea with second infusion -Her anemia has improved but not resolved after IV iron, indicating component of anemia of chronic disease, likely secondary to CKD and underline malignancy    3. HTN, HL, chronic Kidney disease stage III, GERD -Continue medication regimen for chronic conditions -f/u with PCP regularly  -We discussed the impact of chemotherapy on her blood pressure, renal function, etc.  Will monitor her closely during chemotherapy.  4. Goal of care discussion  -We again discussed the incurable nature of her cancer, and the overall poor prognosis, especially if she does not have good response to chemotherapy or progress on chemo -The patient understands the goal of care is palliative. -she is full code now    PLAN: -Lab result discussed with pt and his son -continue IVF today -will admit pt to [facility] hospital today for further work up and management of her AKI under the hospitalist team care   Both patient and her son had many questions about her diagnosis, prognosis, and treatment options, all questions were answered. The patient knows to call the clinic with any problems, questions or concerns. No barriers to learning was detected. I spent 30 minutes counseling the patient face to face. The total time spent in the appointment was 40 minutes and more than 50% was on counseling and review of test results     Truitt Merle, MD 10/21/2017

## 2017-10-23 ENCOUNTER — Inpatient Hospital Stay (HOSPITAL_COMMUNITY): Payer: Medicare Other

## 2017-10-23 DIAGNOSIS — N179 Acute kidney failure, unspecified: Principal | ICD-10-CM

## 2017-10-23 DIAGNOSIS — R609 Edema, unspecified: Secondary | ICD-10-CM

## 2017-10-23 LAB — CBC
HEMATOCRIT: 24.3 % — AB (ref 36.0–46.0)
Hemoglobin: 8.2 g/dL — ABNORMAL LOW (ref 12.0–15.0)
MCH: 27 pg (ref 26.0–34.0)
MCHC: 33.7 g/dL (ref 30.0–36.0)
MCV: 79.9 fL (ref 78.0–100.0)
PLATELETS: 175 10*3/uL (ref 150–400)
RBC: 3.04 MIL/uL — ABNORMAL LOW (ref 3.87–5.11)
RDW: 20.2 % — AB (ref 11.5–15.5)
WBC: 7 10*3/uL (ref 4.0–10.5)

## 2017-10-23 LAB — BASIC METABOLIC PANEL
ANION GAP: 6 (ref 5–15)
BUN: 43 mg/dL — ABNORMAL HIGH (ref 6–20)
CALCIUM: 7.7 mg/dL — AB (ref 8.9–10.3)
CO2: 19 mmol/L — AB (ref 22–32)
Chloride: 108 mmol/L (ref 101–111)
Creatinine, Ser: 1.99 mg/dL — ABNORMAL HIGH (ref 0.44–1.00)
GFR calc non Af Amer: 22 mL/min — ABNORMAL LOW (ref >60)
GFR, EST AFRICAN AMERICAN: 26 mL/min — AB (ref >60)
GLUCOSE: 99 mg/dL (ref 65–99)
Potassium: 5.1 mmol/L (ref 3.5–5.1)
Sodium: 133 mmol/L — ABNORMAL LOW (ref 135–145)

## 2017-10-23 LAB — UREA NITROGEN, URINE: UREA NITROGEN UR: 577 mg/dL

## 2017-10-23 MED ORDER — SODIUM CHLORIDE 0.9% FLUSH
10.0000 mL | Freq: Two times a day (BID) | INTRAVENOUS | Status: DC
  Administered 2017-10-24: 10 mL

## 2017-10-23 MED ORDER — ZOLPIDEM TARTRATE 5 MG PO TABS
2.5000 mg | ORAL_TABLET | Freq: Every evening | ORAL | Status: DC | PRN
  Administered 2017-10-23: 2.5 mg via ORAL
  Filled 2017-10-23: qty 1

## 2017-10-23 MED ORDER — CYCLOBENZAPRINE HCL 5 MG PO TABS: 5.0000 mg | ORAL_TABLET | Freq: Three times a day (TID) | ORAL | Status: DC | PRN

## 2017-10-23 MED ORDER — SODIUM CHLORIDE 0.9% FLUSH: 10.0000 mL | INTRAVENOUS | Status: DC | PRN

## 2017-10-23 NOTE — Progress Notes (Signed)
Bilateral lower extremity venous duplex has been completed. Negative for DVT.  10/23/17 3:08 PM Carlos Levering RVT

## 2017-10-23 NOTE — Progress Notes (Signed)
PROGRESS NOTE    Janet Barnes  XLK:[id number] DOB: [birth year] DOA: 10/21/2017 PCP: Willey Blade, MD   Brief Narrative:  Janet Barnes 84 y.o.femalewith medical history significant ofcolon cancer with metastases on chemotherapyfollowed by Dr. Erie Noe presented after recent abnormal lab work. Patient had just recently had Blakeley Margraf port placed on 2/20 by Dr. Dema Severin and had began her first chemotherapy treatment the following day. Since that time patient reported urinating without any issues. She does complain of intermittent sharp left upper abdominal pain, but attributes this to her cancer. She makes note that she did not like taking hydrocodone as it made her constipated,and then MiraLAX if taken too often gave her diarrhea. Associated symptoms include lower extremity swelling, decreased oral intake of foods, andincreased mucus at the back of her throat that makes it difficult for her to eat. At Dr. Ernestina Penna office, she was found to have AKI and was admitted to [facility] for work up and treatment.   Assessment & Plan:   Principal Problem:   ARF (acute renal failure) (HCC) Active Problems:   Malignant neoplasm of ascending colon (HCC)   Leukocytosis   Hyperkalemia   Lower extremity edema   GERD (gastroesophageal reflux disease)   Acute kidney injury on CKD stage 3  -Patient's baseline creatinine previously noted to be around 1.2-1.4 -peaked to 2.77, improving to 1.99 today -Renal US with bilateral mild hydro (mild to moderate R and mild L), urology consulted -Improving slowly, trend BMP  Normocytic normochromic anemia -Baseline hemoglobin appears between 8 and 10 -Stable   Hyponatremia -Improving with IVF   Essential hypertension -ContinueNebivolol -Hold Spironolactone in setting of AKI   Metastatic colon cancer -Patient status post first chemotherpytreatment on 2/21currently being followed by Dr. Burr Medico -Continue allopurinol    Hypoalbuminemia -Dietitian consult   Dyslipidemia -Continue fenofibrate  GERD -ContinueProtonix  Mildly elevated AST  Alk phos:  - follow   DVT prophylaxis: lovenox Code Status: full  Family Communication: son at bedside, duaghter wendy over phone Disposition Plan: pending urology evaluation    Consultants:   Urology  oncology  Procedures:   none  Antimicrobials: Anti-infectives (From admission, onward)   None      Subjective: No complaints. Some swelling to LE's noted last night.  Otherwise doing ok.   Objective: Vitals:   10/21/17 2054 10/22/17 1300 10/22/17 2200 10/23/17 0542  BP: 114/67 127/60 (!) 114/52 (!) 116/56  Pulse: 72 72 76 77  Resp: _0 Temp: 98.9 F (37.2 C) 97.9 F (36.6 C) 99.2 F (37.3 C) 97.8 F (36.6 C)  TempSrc: Oral Axillary Oral Oral  SpO2: 100% 98% 100% 100%  Weight:      Height:        Intake/Output Summary (Last 24 hours) at 10/23/2017 1125 Last data filed at 10/22/2017 2200 Gross per 24 hour  Intake 240 ml  Output 250 ml  Net -10 ml   Filed Weights   10/21/17 1831  Weight: 79.4 kg (175 lb 0.7 oz)    Examination:  General exam: Appears calm and comfortable  Respiratory system: Clear to auscultation. Respiratory effort normal. Cardiovascular system: S1 & S2 heard, RRR. No JVD, murmurs, rubs, gallops or clicks. No pedal edema. Gastrointestinal system: Abdomen is nondistended, soft and nontender. No organomegaly or masses felt. Normal bowel sounds heard. Central nervous system: Alert and oriented. No focal neurological deficits. Extremities: LEE R slightly more than L (1-2+) Skin: No rashes, lesions or ulcers Psychiatry: Judgement and insight  appear normal. Mood & affect appropriate.     Data Reviewed: I have personally reviewed following labs and imaging studies  CBC: Recent Labs  Lab 10/17/17 0942 10/18/17 1141 10/21/17 1005 10/22/17 0437 10/23/17 0500  WBC 13.8* 16.9* 12.5* 7.7 7.0   NEUTROABS 12.3* 15.1* 11.7*  --   --   HGB  --   --   --  8.4* 8.2*  HCT 31.4* 31.2* 28.1* 25.0* 24.3*  MCV 80.2 80.7 79.6 78.9 79.9  PLT 375 352 244 234 297   Basic Metabolic Panel: Recent Labs  Lab 10/18/17 1141 10/21/17 1005 10/21/17 2118 10/22/17 0437 10/23/17 0500  NA 132* 129* 132* 133* 133*  K 4.6 5.6* 4.9 4.9 5.1  CL 99 99 104 106 108  CO2 21* 20* 19* 18* 19*  GLUCOSE 109 122 108* 107* 99  BUN 45* 66* 61* 56* 43*  CREATININE 2.34* 2.77* 2.44* 2.27* 1.99*  CALCIUM 9.0 8.8 7.8* 7.8* 7.7*  PHOS  --   --  4.0  --   --    GFR: Estimated Creatinine Clearance: 22.1 mL/min (Rosina Cressler) (by C-G formula based on SCr of 1.99 mg/dL (H)). Liver Function Tests: Recent Labs  Lab 10/17/17 0942 10/18/17 1141 10/21/17 1005 10/22/17 0437  AST 32 42* 29 45*  ALT _0 ALKPHOS 51 83 97 147*  BILITOT 0.4 1.9* 1.3* 1.2  PROT 6.2* 6.4 6.0* 5.4*  ALBUMIN 2.6* 2.7* 2.5* 2.2*   No results for input(s): LIPASE, AMYLASE in the last 168 hours. No results for input(s): AMMONIA in the last 168 hours. Coagulation Profile: No results for input(s): INR, PROTIME in the last 168 hours. Cardiac Enzymes: No results for input(s): CKTOTAL, CKMB, CKMBINDEX, TROPONINI in the last 168 hours. BNP (last 3 results) No results for input(s): PROBNP in the last 8760 hours. HbA1C: No results for input(s): HGBA1C in the last 72 hours. CBG: No results for input(s): GLUCAP in the last 168 hours. Lipid Profile: No results for input(s): CHOL, HDL, LDLCALC, TRIG, CHOLHDL, LDLDIRECT in the last 72 hours. Thyroid Function Tests: No results for input(s): TSH, T4TOTAL, FREET4, T3FREE, THYROIDAB in the last 72 hours. Anemia Panel: No results for input(s): VITAMINB12, FOLATE, FERRITIN, TIBC, IRON, RETICCTPCT in the last 72 hours. Sepsis Labs: No results for input(s): PROCALCITON, LATICACIDVEN in the last 168 hours.  Recent Results (from the past 240 hour(s))  Culture, Urine     Status: None   Collection  Time: 10/17/17  3:50 PM  Result Value Ref Range Status   Specimen Description   Final    URINE, CLEAN CATCH Performed at [facility], [address], [place], [place] [address]    Special Requests   Final    NONE Performed at [facility], [place] [address], [place], [place] [address]    Culture   Final    NO GROWTH Performed at [facility], [place] [address], [place], [place] [address]    Report Status 10/18/2017 FINAL  Final         Radiology Studies: US Renal  Result Date: 10/22/2017 CLINICAL DATA:  Acute renal failure. EXAM: RENAL / URINARY TRACT ULTRASOUND COMPLETE COMPARISON:  No prior. FINDINGS: Right Kidney: Length: 10.4 cm. Mild to moderate right hydronephrosis. No focal mass lesion. Echogenicity normal. Left Kidney: Length: 8.7 cm. Mild left hydronephrosis. No focal mass lesion. Echogenicity normal. Bladder: Appears normal for degree of bladder distention. Mild ascites. IMPRESSION: 1. Mild to moderate right  hydronephrosis. Mild left hydronephrosis. No bladder distention. 2.  Mild ascites. Electronically Signed   By: Marcello Moores  Register   On: 10/22/2017 12:35   Dg Chest Port 1 View  Result Date: 10/22/2017 CLINICAL DATA:  Congestion EXAM: PORTABLE CHEST 1 VIEW COMPARISON:  October 16, 2017 FINDINGS: Port-Laurent Cargile-Cath tip is at the cavoatrial junction. No pneumothorax. There is Taegan Haider small left pleural effusion with mild left base atelectasis. Lungs elsewhere are clear. Heart is mildly enlarged with pulmonary vascularity within normal limits. No adenopathy. There is aortic atherosclerosis. There is degenerative change in each shoulder. IMPRESSION: Small left pleural effusion with left base atelectasis. Lungs elsewhere clear. Stable cardiac prominence. There is aortic atherosclerosis. Port-Thelmer Legler-Cath tip is at the cavoatrial junction. No pneumothorax. Aortic Atherosclerosis (ICD10-I70.0). Electronically Signed   By: Lowella Grip III M.D.    On: 10/22/2017 07:24        Scheduled Meds: . allopurinol  300 mg Oral Daily  . fenofibrate  160 mg Oral Daily  . gabapentin  300 mg Oral Daily  . guaiFENesin  600 mg Oral BID  . heparin  5,000 Units Subcutaneous Q8H  . lactose free nutrition  237 mL Oral Q24H  . nebivolol  20 mg Oral Daily  . pantoprazole  40 mg Oral Daily   Continuous Infusions: . sodium chloride 125 mL/hr at 10/23/17 0545     LOS: 2 days    Time spent: over 30 min    Fayrene Helper, MD [facility] Pager [phone]  If 7PM-7AM, please contact night-coverage [email or web address] Password [facility] 10/23/2017, 11:25 AM

## 2017-10-23 NOTE — Consult Note (Addendum)
Urology Consult   Physician requesting consult: Riley Kill. MD  Reason for consult: Hydronephrosis, acute renal insufficiency  History of Present Illness: Janet Barnes is a 84 y.o. female with known metastatic colon cancer.  She is currently receiving chemotherapy by Dr. Annamaria Boots.  She was recently admitted for management acute renal failure.  She was recently found to have creatinine of 2.77.  She does have abdominal symptoms associated with her metastatic colon cancer.  During her short hospitalization so far, creatinine has responded fairly appropriately to hydration with her most recent creatinine 1.99.  Baseline creatinine is approximately 1.2.  CT scan from January, 2019 failed to show any significant renal abnormalities.  However, with her recent creatinne bump, ultrasound was performed revealing bilateral hydronephrosis.  Urologic consultation is requested.  She denies any flank pain.  She has had no blood in her urine. Urinalysis performed on 10/21/2017 revealed no evidence of infection.  Patient that    Past Medical History:  Diagnosis Date  . Allergy    seasonal  . Anemia    iroin infusion - 07/2017   . Blood transfusion without reported diagnosis    IV Feraheme  . Cancer [facility])    colon cancer   . Chronic kidney disease   . Edema    in feet started on 10/13/17- patient stated she elevated feet and went away, call into PCP- Dr Karlton Lemon   . GERD (gastroesophageal reflux disease)   . Hyperlipidemia   . Hypertension   . Macular degeneration   . Macular degeneration    receives eye injections every 6 months by Dr Ricki Miller at [facility]  . PONV (postoperative nausea and vomiting)   . Retinal break of left eye 2013    Past Surgical History:  Procedure Laterality Date  . ABDOMINAL HYSTERECTOMY  1977  . PORTACATH PLACEMENT N/A 10/16/2017   Procedure: INSERTION PORT-A-CATH;  Surgeon: Ileana Roup, MD;  Location: [facility] ORS;  Service: General;  Laterality: N/A;      Current Hospital Medications: Scheduled Meds: . allopurinol  300 mg Oral Daily  . fenofibrate  160 mg Oral Daily  . gabapentin  300 mg Oral Daily  . guaiFENesin  600 mg Oral BID  . heparin  5,000 Units Subcutaneous Q8H  . lactose free nutrition  237 mL Oral Q24H  . nebivolol  20 mg Oral Daily  . pantoprazole  40 mg Oral Daily   Continuous Infusions: . sodium chloride 125 mL/hr at 10/23/17 0545   PRN Meds:.acetaminophen **OR** acetaminophen, albuterol, ondansetron **OR** ondansetron (ZOFRAN) IV, polyethylene glycol, polyvinyl alcohol  Allergies:  Allergies  Allergen Reactions  . Feraheme [Ferumoxytol] Shortness Of Breath and Other (See Comments)    Dyspnea 30 min after infusion (happened after 2nd infusion only)  . Codeine Nausea Only  . Sulfur Other (See Comments)    Abdominal burning    Family History  Problem Relation Age of Onset  . Colon cancer Mother 72       colon   . Heart disease Father   . Cancer Sister   . Pancreatic cancer Sister 37       pancreatic   . Rectal cancer Neg Hx   . Stomach cancer Neg Hx   . Liver cancer Neg Hx     Social History:  reports that she has quit smoking. she has never used smokeless tobacco. She reports that she does not drink alcohol or use drugs.  ROS: A complete review of systems was performed.  All  systems are negative except for pertinent findings as noted.  Physical Exam:  Vital signs in last 24 hours: Temp:  [97.8 F (36.6 C)-99.2 F (37.3 C)] 98 F (36.7 C) (02/27 1331) Pulse Rate:  [71-77] 71 (02/27 1331) Resp:  [16-18] 18 (02/27 1331) BP: (114-123)/(50-56) 123/50 (02/27 1331) SpO2:  [100 %] 100 % (02/27 1331) General:  Alert and oriented, No acute distress, resting comfortably in bed HEENT: Normocephalic, atraumatic Neck: No JVD  Cardiovascular: Regular rate  Lungs: Normal inspiratory/expiratory excursion Neurologic: Grossly intact  Laboratory Data:  Recent Labs    10/21/17 1005 10/22/17 0437  10/23/17 0500  WBC 12.5* 7.7 7.0  HGB  --  8.4* 8.2*  HCT 28.1* 25.0* 24.3*  PLT 244 234 175    Recent Labs    10/21/17 1005 10/21/17 2118 10/22/17 0437 10/23/17 0500  NA 129* 132* 133* 133*  K 5.6* 4.9 4.9 5.1  CL 99 104 106 108  GLUCOSE 122 108* 107* 99  BUN 66* 61* 56* 43*  CALCIUM 8.8 7.8* 7.8* 7.7*  CREATININE 2.77* 2.44* 2.27* 1.99*     Results for orders placed or performed during the hospital encounter of 10/21/17 (from the past 24 hour(s))  CBC     Status: Abnormal   Collection Time: 10/23/17  5:00 AM  Result Value Ref Range   WBC 7.0 4.0 - 10.5 K/uL   RBC 3.04 (L) 3.87 - 5.11 MIL/uL   Hemoglobin 8.2 (L) 12.0 - 15.0 g/dL   HCT 24.3 (L) 36.0 - 46.0 %   MCV 79.9 78.0 - 100.0 fL   MCH 27.0 26.0 - 34.0 pg   MCHC 33.7 30.0 - 36.0 g/dL   RDW 20.2 (H) 11.5 - 15.5 %   Platelets 175 150 - 400 K/uL  Basic metabolic panel     Status: Abnormal   Collection Time: 10/23/17  5:00 AM  Result Value Ref Range   Sodium 133 (L) 135 - 145 mmol/L   Potassium 5.1 3.5 - 5.1 mmol/L   Chloride 108 101 - 111 mmol/L   CO2 19 (L) 22 - 32 mmol/L   Glucose, Bld 99 65 - 99 mg/dL   BUN 43 (H) 6 - 20 mg/dL   Creatinine, Ser 1.99 (H) 0.44 - 1.00 mg/dL   Calcium 7.7 (L) 8.9 - 10.3 mg/dL   GFR calc non Af Amer 22 (L) >60 mL/min   GFR calc Af Amer 26 (L) >60 mL/min   Anion gap 6 5 - 15   Recent Results (from the past 240 hour(s))  Culture, Urine     Status: None   Collection Time: 10/17/17  3:50 PM  Result Value Ref Range Status   Specimen Description   Final    URINE, CLEAN CATCH Performed at [facility], [address], [place], [place] [address]    Special Requests   Final    NONE Performed at [facility], [place] [address], [place], [place] [address]    Culture   Final    NO GROWTH Performed at [facility], [place] [address], [place], [place] [address]    Report Status 10/18/2017 FINAL  Final    Renal Function: Recent  Labs    10/17/17 0942 10/18/17 1141 10/21/17 1005 10/21/17 2118 10/22/17 0437 10/23/17 0500  CREATININE 2.26* 2.34* 2.77* 2.44* 2.27* 1.99*   Estimated Creatinine Clearance: 22.1 mL/min (A) (by C-G formula based on SCr of 1.99 mg/dL (H)).  Radiologic Imaging: US Renal  Result Date: 10/22/2017 CLINICAL DATA:  Acute renal failure. EXAM: RENAL / URINARY TRACT ULTRASOUND COMPLETE COMPARISON:  No prior. FINDINGS: Right Kidney: Length: 10.4 cm. Mild to moderate right hydronephrosis. No focal mass lesion. Echogenicity normal. Left Kidney: Length: 8.7 cm. Mild left hydronephrosis. No focal mass lesion. Echogenicity normal. Bladder: Appears normal for degree of bladder distention. Mild ascites. IMPRESSION: 1. Mild to moderate right hydronephrosis. Mild left hydronephrosis. No bladder distention. 2.  Mild ascites. Electronically Signed   By: Marcello Moores  Register   On: 10/22/2017 12:35   Dg Chest Port 1 View  Result Date: 10/22/2017 CLINICAL DATA:  Congestion EXAM: PORTABLE CHEST 1 VIEW COMPARISON:  October 16, 2017 FINDINGS: Port-A-Cath tip is at the cavoatrial junction. No pneumothorax. There is a small left pleural effusion with mild left base atelectasis. Lungs elsewhere are clear. Heart is mildly enlarged with pulmonary vascularity within normal limits. No adenopathy. There is aortic atherosclerosis. There is degenerative change in each shoulder. IMPRESSION: Small left pleural effusion with left base atelectasis. Lungs elsewhere clear. Stable cardiac prominence. There is aortic atherosclerosis. Port-A-Cath tip is at the cavoatrial junction. No pneumothorax. Aortic Atherosclerosis (ICD10-I70.0). Electronically Signed   By: Lowella Grip III M.D.   On: 10/22/2017 07:24    I independently reviewed the above imaging studies.  Impression/Assessment:  Widely metastatic adenocarcinoma of the colon, with progression of disease, bilateral hydronephrosis symptomatic only with her acute renal insufficiency.   She is responding fairly well to IV hydration at the present time.  Her hydro-is most likely due to retroperitoneal adenopathy.  Plan:  1.  With her progressive disease, I do feel that stent placement is an attractive step at this time.  However, with her renal insufficiency improving, I think that this can be a more elective process within the next week or so  2.  I will follow her creatinine.  If she continues to improve by tomorrow Friday, I think it is fine to let her go home, and we can provide outpatient management with bilateral stent placement early in the week, probably in time for her regular infusion on 3/7.

## 2017-10-24 ENCOUNTER — Ambulatory Visit: Payer: Medicare Other | Admitting: Hematology

## 2017-10-24 ENCOUNTER — Other Ambulatory Visit: Payer: Medicare Other

## 2017-10-24 ENCOUNTER — Inpatient Hospital Stay (HOSPITAL_COMMUNITY): Payer: Medicare Other

## 2017-10-24 ENCOUNTER — Encounter (HOSPITAL_COMMUNITY): Payer: Medicare Other

## 2017-10-24 LAB — HEMOGLOBIN AND HEMATOCRIT, BLOOD
HCT: 25.6 % — ABNORMAL LOW (ref 36.0–46.0)
HEMOGLOBIN: 8.5 g/dL — AB (ref 12.0–15.0)

## 2017-10-24 LAB — CBC
HEMATOCRIT: 23.2 % — AB (ref 36.0–46.0)
HEMOGLOBIN: 7.7 g/dL — AB (ref 12.0–15.0)
MCH: 26.8 pg (ref 26.0–34.0)
MCHC: 33.2 g/dL (ref 30.0–36.0)
MCV: 80.8 fL (ref 78.0–100.0)
PLATELETS: 156 10*3/uL (ref 150–400)
RBC: 2.87 MIL/uL — AB (ref 3.87–5.11)
RDW: 20.2 % — ABNORMAL HIGH (ref 11.5–15.5)
WBC: 8.8 10*3/uL (ref 4.0–10.5)

## 2017-10-24 LAB — COMPREHENSIVE METABOLIC PANEL
ALK PHOS: 284 U/L — AB (ref 38–126)
ALT: 44 U/L (ref 14–54)
AST: 86 U/L — AB (ref 15–41)
Albumin: 1.9 g/dL — ABNORMAL LOW (ref 3.5–5.0)
Anion gap: 6 (ref 5–15)
BUN: 35 mg/dL — ABNORMAL HIGH (ref 6–20)
CALCIUM: 7.6 mg/dL — AB (ref 8.9–10.3)
CHLORIDE: 111 mmol/L (ref 101–111)
CO2: 19 mmol/L — ABNORMAL LOW (ref 22–32)
CREATININE: 1.9 mg/dL — AB (ref 0.44–1.00)
GFR, EST AFRICAN AMERICAN: 27 mL/min — AB (ref >60)
GFR, EST NON AFRICAN AMERICAN: 24 mL/min — AB (ref >60)
Glucose, Bld: 93 mg/dL (ref 65–99)
Potassium: 4.5 mmol/L (ref 3.5–5.1)
Sodium: 136 mmol/L (ref 135–145)
Total Bilirubin: 1.1 mg/dL (ref 0.3–1.2)
Total Protein: 4.5 g/dL — ABNORMAL LOW (ref 6.5–8.1)

## 2017-10-24 LAB — MAGNESIUM: MAGNESIUM: 2.1 mg/dL (ref 1.7–2.4)

## 2017-10-24 MED ORDER — HEPARIN SOD (PORK) LOCK FLUSH 100 UNIT/ML IV SOLN
500.0000 [IU] | INTRAVENOUS | Status: DC | PRN
  Filled 2017-10-24: qty 5

## 2017-10-24 NOTE — Discharge Summary (Signed)
Physician Discharge Summary  Janet Barnes EVO:[id number] DOB: 06/10/1934 DOA: 10/21/2017  PCP: Willey Blade, MD  Admit date: 10/21/2017 Discharge date: 10/24/2017  Time spent: over 30 minutes  Recommendations for Outpatient Follow-up:  1. Follow up outpatient CBC/CMP (attention to LFT's, renal function - consider allopurinol dosing) 2. Follow up with urology Monday for stent placement  3. Follow LLE pain.  Negative plain films and [place] here, etiology unclear.  Mild, waxing and waning.  4. Spironolactone discontinued with AKI   Discharge Diagnoses:  Principal Problem:   ARF (acute renal failure) ([place]) Active Problems:   Malignant neoplasm of ascending colon (HCC)   Leukocytosis   Hyperkalemia   Lower extremity edema   GERD (gastroesophageal reflux disease)   Discharge Condition: stable  Diet recommendation: heart healthy  Filed Weights   10/21/17 1831  Weight: 79.4 kg (175 lb 0.7 oz)    History of present illness:  Per PN Janet Barnes a 84 y.o.femalewith medical history significant ofcolon cancer with metastases on chemotherapyfollowed by Dr. Erie Noe presented after recent abnormal lab work. Patient had just recently had a port placed on 2/20 by Dr. Dema Severin and had began her first chemotherapy treatment the following day. Since that time patient reported urinating without any issues. She does complain of intermittent sharp left upper abdominal pain, but attributes this to her cancer. She makes note that she did not like taking hydrocodone as it made her constipated,and then MiraLAX if taken too often gave her diarrhea. Associated symptoms include lower extremity swelling, decreased oral intake of foods, andincreased mucus at the back of her throat that makes it difficult for her to eat.At Dr. Ernestina Penna office, she was found to have AKI and was admitted to [facility] for work up and treatment  Her creatinine has gradually improved with IVF,  but she was noted to have bilateral hydronephrosis on renal US.  She was seen by urology who is planning for stent placement this upcoming Monday.    Hospital Course:  Acutekidney injury on CKD stage 3  -Patient's baseline creatinine previously noted to be around 1.2-1.4 -peaked to 2.77, improving to 1.90 today -Renal US with bilateral mild hydro (mild to moderate R and mild L), urology consulted and planning for outpatient stent placement on Monday  Normocytic normochromic anemia -Baseline hemoglobin appears between 8 and 10 -Stableat discharge  Hyponatremia -Improved  Essential hypertension -ContinueNebivolol -Hold Spironolactonein setting of AKI  Metastatic colon cancer -Patient status post first chemotherpytreatment on 2/21currently being followed by Dr. Burr Medico -Continue allopurinol  Hypoalbuminemia -Dietitian consult  Dyslipidemia -Continue fenofibrate  GERD -ContinueProtonix  Elevated AST  Alk phos:  - continue to follow as outpatient  Procedures:  none  Consultations:  Oncology  Urology  Discharge Exam: Vitals:   10/23/17 2027 10/24/17 0437  BP: (!) 144/64 (!) 121/47  Pulse: 70 71  Resp: 16 10  Temp: 98.2 F (36.8 C) 98.6 F (37 C)  SpO2: 100% 100%   Feeling better.  Some LLE pain, comes and goes.  Otherwise feeling well.  General: No acute distress. Cardiovascular: Heart sounds show a regular rate, and rhythm. No gallops or rubs. No murmurs. No JVD. Lungs: Clear to auscultation bilaterally with good air movement. No rales, rhonchi or wheezes. Abdomen: Soft, nontender, nondistended with normal active bowel sounds. No masses. No hepatosplenomegaly. Neurological: Alert and oriented 3. Moves all extremities 4. Cranial nerves II through XII grossly intact. Skin: Warm and dry. No rashes or lesions. Extremities: Mild L>R LEE.  Mild  TTP to LLE.  Psychiatric: Mood and affect are normal. Insight and judgment are  appropriate.  Discharge Instructions   Discharge Instructions    Call MD for:  difficulty breathing, headache or visual disturbances   Complete by:  As directed    Call MD for:  extreme fatigue   Complete by:  As directed    Call MD for:  persistant dizziness or light-headedness   Complete by:  As directed    Call MD for:  persistant nausea and vomiting   Complete by:  As directed    Call MD for:  severe uncontrolled pain   Complete by:  As directed    Call MD for:  temperature >100.4   Complete by:  As directed    Diet - low sodium heart healthy   Complete by:  As directed    Diet - low sodium heart healthy   Complete by:  As directed    Discharge instructions   Complete by:  As directed    You were seen for impaired kidney function.  This is improving, but you need stent placement which will be done on Monday as an outpatient.  Your liver enzymes are slightly elevated.  Please follow up your allopurinol dosing and your repeat labs with Dr. Burr Medico.   Please call to confirm this procedure tomorrow.  Please follow up with Dr. Burr Medico as scheduled.  Please follow up with your PCP for labs in the interim to make sure things continue to improve and to follow up your lower extremity edema and your left leg pain.  For your leg pain, you can use your norco as needed.  Keeping your legs elevated and compression stockings will help with the swelling.  Do not take the spironolactone anymore until you're directed otherwise by your doctor.  Return if you have new, recurrent, or worsening symptoms.  Please ask your PCP to request records so they know what was done and what the next steps are.   Increase activity slowly   Complete by:  As directed    Increase activity slowly   Complete by:  As directed      Allergies as of 10/24/2017      Reactions   Feraheme [ferumoxytol] Shortness Of Breath, Other (See Comments)   Dyspnea 30 min after infusion (happened after 2nd infusion only)   Codeine  Nausea Only   Sulfur Other (See Comments)   Abdominal burning      Medication List    STOP taking these medications   spironolactone 50 MG tablet Commonly known as:  ALDACTONE     TAKE these medications   acetaminophen 500 MG tablet Commonly known as:  TYLENOL Take 500 mg by mouth every 6 (six) hours as needed for moderate pain or headache.   allopurinol 300 MG tablet Commonly known as:  ZYLOPRIM Take 1 tablet (300 mg total) by mouth daily.   BLINK TEARS OP Place 1 drop into both eyes daily as needed (for dry eyes).   BYSTOLIC 20 MG Tabs Generic drug:  Nebivolol HCl Take 20 mg by mouth daily.   CO Q 10 PO Take 1 capsule by mouth daily.   diphenoxylate-atropine 2.5-0.025 MG/5ML liquid Commonly known as:  LOMOTIL Take 5 mLs by mouth 4 (four) times daily as needed for diarrhea or loose stools.   fenofibrate 160 MG tablet Take 160 mg by mouth daily.   gabapentin 300 MG capsule Commonly known as:  NEURONTIN Take 1 capsule (300 mg total) by  mouth 3 (three) times daily.   HYDROcodone-acetaminophen 5-325 MG tablet Commonly known as:  NORCO/VICODIN Take 1 tablet by mouth every 6-8 hours as needed for pain What changed:    how much to take  how to take this  when to take this  reasons to take this  additional instructions   lidocaine-prilocaine cream Commonly known as:  EMLA Apply to affected area once   miconazole 2 % powder Commonly known as:  MICOTIN Apply 1 application topically daily as needed for itching.   ondansetron 8 MG tablet Commonly known as:  ZOFRAN Take 1 tablet (8 mg total) by mouth 2 (two) times daily as needed for refractory nausea / vomiting. Start on day 3 after chemotherapy.   pantoprazole 40 MG tablet Commonly known as:  PROTONIX Take 1 tablet (40 mg total) by mouth daily.   polyethylene glycol packet Commonly known as:  MIRALAX / GLYCOLAX Take 17 g by mouth daily as needed for moderate constipation.   prochlorperazine 10 MG  tablet Commonly known as:  COMPAZINE Take 1 tablet (10 mg total) by mouth every 6 (six) hours as needed (Nausea or vomiting).   Vitamin D 2000 units tablet Take 2,000 Units by mouth daily.      Allergies  Allergen Reactions  . Feraheme [Ferumoxytol] Shortness Of Breath and Other (See Comments)    Dyspnea 30 min after infusion (happened after 2nd infusion only)  . Codeine Nausea Only  . Sulfur Other (See Comments)    Abdominal burning   Follow-up Information    Willey Blade, MD Follow up.   Specialty:  Internal Medicine Contact information: [address] [place] [address] [phone]        Truitt Merle, MD Follow up.   Specialties:  Hematology, Oncology Contact information: [place] [address] [phone]        Franchot Gallo, MD Follow up.   Specialty:  Urology Why:  Call to confirm time for procedure on Monday  Contact information: [place] [address] [phone]            The results of significant diagnostics from this hospitalization (including imaging, microbiology, ancillary and laboratory) are listed below for reference.    Significant Diagnostic Studies: Dg Tibia/fibula Left  Result Date: 10/24/2017 CLINICAL DATA:  Left lower leg pain for 2-3 weeks EXAM: LEFT TIBIA AND FIBULA - 2 VIEW COMPARISON:  None. FINDINGS: Nonspecific subcutaneous reticulation that is generalized. There is no opaque foreign body or soft tissue gas. Muscular atrophy. Moderate degenerative spurring at the knee. Evidence of remote medial collateral ligament injury with heterotopic ossification. Milder degenerative spurring at the ankle. IMPRESSION: 1. Nonspecific soft tissue swelling. No acute osseous finding or soft tissue emphysema. 2. Moderate degenerative spurring at the knee. Electronically Signed   By: Monte Fantasia M.D.   On: 10/24/2017 14:11   US Renal  Result Date: 10/22/2017 CLINICAL DATA:  Acute  renal failure. EXAM: RENAL / URINARY TRACT ULTRASOUND COMPLETE COMPARISON:  No prior. FINDINGS: Right Kidney: Length: 10.4 cm. Mild to moderate right hydronephrosis. No focal mass lesion. Echogenicity normal. Left Kidney: Length: 8.7 cm. Mild left hydronephrosis. No focal mass lesion. Echogenicity normal. Bladder: Appears normal for degree of bladder distention. Mild ascites. IMPRESSION: 1. Mild to moderate right hydronephrosis. Mild left hydronephrosis. No bladder distention. 2.  Mild ascites. Electronically Signed   By: [place]   On: 10/22/2017 12:35   Dg Chest Port 1 View  Result  Date: 10/22/2017 CLINICAL DATA:  Congestion EXAM: PORTABLE CHEST 1 VIEW COMPARISON:  October 16, 2017 FINDINGS: Port-A-Cath tip is at the cavoatrial junction. No pneumothorax. There is a small left pleural effusion with mild left base atelectasis. Lungs elsewhere are clear. Heart is mildly enlarged with pulmonary vascularity within normal limits. No adenopathy. There is aortic atherosclerosis. There is degenerative change in each shoulder. IMPRESSION: Small left pleural effusion with left base atelectasis. Lungs elsewhere clear. Stable cardiac prominence. There is aortic atherosclerosis. Port-A-Cath tip is at the cavoatrial junction. No pneumothorax. Aortic Atherosclerosis (ICD10-I70.0). Electronically Signed   By: Lowella Grip III M.D.   On: 10/22/2017 07:24   Dg Chest Port 1 View  Result Date: 10/16/2017 CLINICAL DATA:  Port placement. EXAM: PORTABLE CHEST 1 VIEW COMPARISON:  None. FINDINGS: Right chest wall port catheter in good position with the tip in the proximal right atrium. The heart size and mediastinal contours are within normal limits. Normal pulmonary vascularity. No focal consolidation, pleural effusion, or pneumothorax. No acute osseous abnormality. IMPRESSION: 1. Appropriately positioned right chest wall port catheter. No active disease. Electronically Signed   By: Titus Dubin M.D.   On:  10/16/2017 14:09   Dg C-arm 1-60 Min-no Report  Result Date: 10/16/2017 Fluoroscopy was utilized by the requesting physician.  No radiographic interpretation.   US Biopsy (abdominal Retropertioneal Mass)  Result Date: 09/26/2017 INDICATION: Colon cancer, firm palpable right anterior chest subcutaneous nodule beneath the right breast EXAM: Ultrasound right chest subcutaneous nodule core biopsy MEDICATIONS: 1% lidocaine local ANESTHESIA/SEDATION: Moderate (conscious) sedation was employed during this procedure. A total of Versed 2.0 mg and Fentanyl 75 mcg was administered intravenously. Moderate Sedation Time: 10 minutes. The patient's level of consciousness and vital signs were monitored continuously by radiology nursing throughout the procedure under my direct supervision. FLUOROSCOPY TIME:  Fluoroscopy Time: None. COMPLICATIONS: None immediate. PROCEDURE: Informed written consent was obtained from the patient after a thorough discussion of the procedural risks, benefits and alternatives. All questions were addressed. Maximal Sterile Barrier Technique was utilized including caps, mask, sterile gowns, sterile gloves, sterile drape, hand hygiene and skin antiseptic. A timeout was performed prior to the initiation of the procedure. Previous imaging reviewed. Preliminary ultrasound performed. The right anterior chest palpable firm nodule beneath the right breast was localized. Overlying skin marked. Under sterile conditions and local anesthesia, an 18 gauge core biopsy was advanced to the lesion. Core biopsies were obtained. Samples placed in formalin. No immediate complication. Patient tolerated the biopsy well. Images obtained for documentation. IMPRESSION: Successful ultrasound right anterior chest subcutaneous nodule core biopsies. Electronically Signed   By: Jerilynn Mages.  Shick M.D.   On: 09/26/2017 13:49    Microbiology: Recent Results (from the past 240 hour(s))  Culture, Urine     Status: None   Collection  Time: 10/17/17  3:50 PM  Result Value Ref Range Status   Specimen Description   Final    URINE, CLEAN CATCH Performed at [facility], [address], [place], [place] [address]    Special Requests   Final    NONE Performed at [facility], [place] [address], [place], [place] [address]    Culture   Final    NO GROWTH Performed at [facility], [place] [address], [place], [place] [address]    Report Status 10/18/2017 FINAL  Final     Labs: Basic Metabolic Panel: Recent Labs  Lab 10/21/17 1005 10/21/17 2118 10/22/17 0437 10/23/17 0500 10/24/17 0440  NA 129* 132* 133* 133* 136  K 5.6* 4.9 4.9 5.1 4.5  CL 99 104 106 108 111  CO2 20* 19* 18* 19* 19*  GLUCOSE 122 108* 107* 99 93  BUN 66* 61* 56* 43* 35*  CREATININE 2.77* 2.44* 2.27* 1.99* 1.90*  CALCIUM 8.8 7.8* 7.8* 7.7* 7.6*  MG  --   --   --   --  2.1  PHOS  --  4.0  --   --   --    Liver Function Tests: Recent Labs  Lab 10/18/17 1141 10/21/17 1005 10/22/17 0437 10/24/17 0440  AST 42* 29 45* 86*  ALT '23 23 28 ' 44  ALKPHOS 83 97 147* 284*  BILITOT 1.9* 1.3* 1.2 1.1  PROT 6.4 6.0* 5.4* 4.5*  ALBUMIN 2.7* 2.5* 2.2* 1.9*   No results for input(s): LIPASE, AMYLASE in the last 168 hours. No results for input(s): AMMONIA in the last 168 hours. CBC: Recent Labs  Lab 10/18/17 1141 10/21/17 1005 10/22/17 0437 10/23/17 0500 10/24/17 0440 10/24/17 1140  WBC 16.9* 12.5* 7.7 7.0 8.8  --   NEUTROABS 15.1* 11.7*  --   --   --   --   HGB  --   --  8.4* 8.2* 7.7* 8.5*  HCT 31.2* 28.1* 25.0* 24.3* 23.2* 25.6*  MCV 80.7 79.6 78.9 79.9 80.8  --   PLT 352 244 234 175 156  --    Cardiac Enzymes: No results for input(s): CKTOTAL, CKMB, CKMBINDEX, TROPONINI in the last 168 hours. BNP: BNP (last 3 results) No results for input(s): BNP in the last 8760 hours.  ProBNP (last 3 results) No results for input(s): PROBNP in the last 8760 hours.  CBG: No results for input(s):  GLUCAP in the last 168 hours.     Signed:  Fayrene Helper MD.  [facility] 10/24/2017, 7:47 PM

## 2017-10-24 NOTE — Care Management Important Message (Signed)
Important Message  Patient Details  Name: JASMEET MANTON MRN: [id number] Date of Birth: 1934/08/10   Medicare Important Message Given:  Yes    Kerin Salen 10/24/2017, 12:04 [place] Message  Patient Details  Name: JERALYNN VAQUERA MRN: [id number] Date of Birth: 03/05/1934   Medicare Important Message Given:  Yes    Kerin Salen 10/24/2017, 12:04 PM

## 2017-10-24 NOTE — Progress Notes (Signed)
Pt to be discharged home. Discharge instructions completed.  Pt and son verbalizes understanding.

## 2017-10-24 NOTE — Progress Notes (Signed)
Janet Barnes   DOB:[birth year]   GU#:[id number]   SEG#:[id number]  Oncology follow-up note  Subjective: Patient was sitting in chair to have lunch when I saw her today.  She feels better overall, with better appetite and energy level, she has also been ambulating in the hallway.  She has developed leg swelling.  She has noticed her subcutaneous nodules has shrunk since chemo last week.   Objective:  Vitals:   10/23/17 2027 10/24/17 0437  BP: (!) 144/64 (!) 121/47  Pulse: 70 71  Resp: 16 10  Temp: 98.2 F (36.8 C) 98.6 F (37 C)  SpO2: 100% 100%    Body mass index is 31.01 kg/m.  Intake/Output Summary (Last 24 hours) at 10/24/2017 1425 Last data filed at 10/23/2017 1839 Gross per 24 hour  Intake 240 ml  Output -  Net 240 ml     Sclerae unicteric  Lungs clear -- no rales or rhonchi  Heart regular rate and rhythm  Abdomen benign  (+) diffuse subcutaneous metastatic nodules, smaller then before   CBG (last 3)  No results for input(s): GLUCAP in the last 72 hours.   Labs:  Lab Results  Component Value Date   WBC 8.8 10/24/2017   HGB 8.5 (L) 10/24/2017   HCT 25.6 (L) 10/24/2017   MCV 80.8 10/24/2017   PLT 156 10/24/2017   NEUTROABS 11.7 (H) 10/21/2017   CMP Latest Ref Rng & Units 10/24/2017 10/23/2017 10/22/2017  Glucose 65 - 99 mg/dL 93 99 107(H)  BUN 6 - 20 mg/dL 35(H) 43(H) 56(H)  Creatinine 0.44 - 1.00 mg/dL 1.90(H) 1.99(H) 2.27(H)  Sodium 135 - 145 mmol/L 136 133(L) 133(L)  Potassium 3.5 - 5.1 mmol/L 4.5 5.1 4.9  Chloride 101 - 111 mmol/L 111 108 106  CO2 22 - 32 mmol/L 19(L) 19(L) 18(L)  Calcium 8.9 - 10.3 mg/dL 7.6(L) 7.7(L) 7.8(L)  Total Protein 6.5 - 8.1 g/dL 4.5(L) - 5.4(L)  Total Bilirubin 0.3 - 1.2 mg/dL 1.1 - 1.2  Alkaline Phos 38 - 126 U/L 284(H) - 147(H)  AST 15 - 41 U/L 86(H) - 45(H)  ALT 14 - 54 U/L 44 - 28     Urine Studies No results for input(s): UHGB, CRYS in the last 72 hours.  Invalid input(s): UACOL, UAPR, USPG, UPH, UTP, UGL, UKET,  UBIL, UNIT, UROB, [place], UEPI, UWBC, [place], [place], [place], [place], [place]  Basic Metabolic Panel: Recent Labs  Lab 10/21/17 1005 10/21/17 2118 10/22/17 0437 10/23/17 0500 10/24/17 0440  NA 129* 132* 133* 133* 136  K 5.6* 4.9 4.9 5.1 4.5  CL 99 104 106 108 111  CO2 20* 19* 18* 19* 19*  GLUCOSE 122 108* 107* 99 93  BUN 66* 61* 56* 43* 35*  CREATININE 2.77* 2.44* 2.27* 1.99* 1.90*  CALCIUM 8.8 7.8* 7.8* 7.7* 7.6*  MG  --   --   --   --  2.1  PHOS  --  4.0  --   --   --    GFR Estimated Creatinine Clearance: 23.2 mL/min (A) (by C-G formula based on SCr of 1.9 mg/dL (H)). Liver Function Tests: Recent Labs  Lab 10/18/17 1141 10/21/17 1005 10/22/17 0437 10/24/17 0440  AST 42* 29 45* 86*  ALT 23 23 28  44  ALKPHOS 83 97 147* 284*  BILITOT 1.9* 1.3* 1.2 1.1  PROT 6.4 6.0* 5.4* 4.5*  ALBUMIN 2.7* 2.5* 2.2* 1.9*   No results for input(s): LIPASE, AMYLASE in the last 168 hours. No results for input(s): AMMONIA  in the last 168 hours. Coagulation profile No results for input(s): INR, PROTIME in the last 168 hours.  CBC: Recent Labs  Lab 10/18/17 1141 10/21/17 1005 10/22/17 0437 10/23/17 0500 10/24/17 0440 10/24/17 1140  WBC 16.9* 12.5* 7.7 7.0 8.8  --   NEUTROABS 15.1* 11.7*  --   --   --   --   HGB  --   --  8.4* 8.2* 7.7* 8.5*  HCT 31.2* 28.1* 25.0* 24.3* 23.2* 25.6*  MCV 80.7 79.6 78.9 79.9 80.8  --   PLT 352 244 234 175 156  --    Cardiac Enzymes: No results for input(s): CKTOTAL, CKMB, CKMBINDEX, TROPONINI in the last 168 hours. BNP: Invalid input(s): POCBNP CBG: No results for input(s): GLUCAP in the last 168 hours. D-Dimer No results for input(s): DDIMER in the last 72 hours. Hgb A1c No results for input(s): HGBA1C in the last 72 hours. Lipid Profile No results for input(s): CHOL, HDL, LDLCALC, TRIG, CHOLHDL, LDLDIRECT in the last 72 hours. Thyroid function studies No results for input(s): TSH, T4TOTAL, T3FREE, THYROIDAB in the last 72 hours.  Invalid  input(s): FREET3 Anemia work up No results for input(s): VITAMINB12, FOLATE, FERRITIN, TIBC, IRON, RETICCTPCT in the last 72 hours. Microbiology Recent Results (from the past 240 hour(s))  Culture, Urine     Status: None   Collection Time: 10/17/17  3:50 PM  Result Value Ref Range Status   Specimen Description   Final    URINE, CLEAN CATCH Performed at [facility], [address], [place], [place] [address]    Special Requests   Final    NONE Performed at [facility], [place] [address], [place], [place] [address]    Culture   Final    NO GROWTH Performed at [facility], [place] [address], [place], [place] [address]    Report Status 10/18/2017 FINAL  Final      Studies:  Dg Tibia/fibula Left  Result Date: 10/24/2017 CLINICAL DATA:  Left lower leg pain for 2-3 weeks EXAM: LEFT TIBIA AND FIBULA - 2 VIEW COMPARISON:  None. FINDINGS: Nonspecific subcutaneous reticulation that is generalized. There is no opaque foreign body or soft tissue gas. Muscular atrophy. Moderate degenerative spurring at the knee. Evidence of remote medial collateral ligament injury with heterotopic ossification. Milder degenerative spurring at the ankle. IMPRESSION: 1. Nonspecific soft tissue swelling. No acute osseous finding or soft tissue emphysema. 2. Moderate degenerative spurring at the knee. Electronically Signed   By: Monte Fantasia M.D.   On: 10/24/2017 14:11    Assessment: 84 y.o. with recently diagnosed metastatic colon cancer to lymph nodes, and subcutaneous soft tissue.  1. AKI secondary to tumor lysis syndrome and hydronephrosis, improving  2. B/l hydronephrosis, R>L, possible secondary to retroperitoneal adenopathy 3.  Hyperuricemia, resolved 4.  Hyperkalemia, resolved 5.  Hyponatremia, resolved  6.  Normocytic anemia, secondary to iron deficiency, chemo and malignancy 7. HTN  8.  Metastatic colon cancer, recently started chemotherapy on  10/17/17  Plan:  -Appreciate urologist Dr. Alan Ripper input, patient will have ureteral stent placement on Monday as outpatient -She has developed worsening anemia, partially related to the IV fluids and dilution, repeated hemoglobin 8.5 today, no need of blood transfusion for now -Her creatinine continue to improve, 1.9 today, she is eating better, okay to discharge home from my standpoint -Scheduled to have second cycle chemotherapy next Thursday, will see her back then.  -I appreciate the excellent care from  the hospitalist team    Truitt Merle, MD 10/24/2017  2:25 PM

## 2017-10-24 NOTE — Progress Notes (Signed)
  Subjective: Patient reports that she is feeling fine.  Some complaints of leg pain.  Objective: Vital signs in last 24 hours: Temp:  [98 F (36.7 C)-98.6 F (37 C)] 98.6 F (37 C) (02/28 0437) Pulse Rate:  [70-71] 71 (02/28 0437) Resp:  [10-18] 10 (02/28 0437) BP: (121-144)/(47-64) 121/47 (02/28 0437) SpO2:  [100 %] 100 % (02/28 0437)  Intake/Output from previous day: 02/27 0701 - 02/28 0700 In: 480 [P.O.:480] Out: -  Intake/Output this shift: No intake/output data recorded.  Physical Exam:  Constitutional: Vital signs reviewed. WD WN in NAD   Eyes: PERRL, No scleral icterus.   Pulmonary/Chest: Normal effort Malignant hydronephrosis  Lab Results: Recent Labs    10/22/17 0437 10/23/17 0500 10/24/17 0440  HGB 8.4* 8.2* 7.7*  HCT 25.0* 24.3* 23.2*   BMET Recent Labs    10/23/17 0500 10/24/17 0440  NA 133* 136  K 5.1 4.5  CL 108 111  CO2 19* 19*  GLUCOSE 99 93  BUN 43* 35*  CREATININE 1.99* 1.90*  CALCIUM 7.7* 7.6*   No results for input(s): LABPT, INR in the last 72 hours. No results for input(s): LABURIN in the last 72 hours. Results for orders placed or performed in visit on 10/17/17  Culture, Urine     Status: None   Collection Time: 10/17/17  3:50 PM  Result Value Ref Range Status   Specimen Description   Final    URINE, CLEAN CATCH Performed at [facility], [address], [place], [place] [address]    Special Requests   Final    NONE Performed at [facility], [place] [address], [place], [place] [address]    Culture   Final    NO GROWTH Performed at [facility], [place] [address] [place], [place] [address]    Report Status 10/18/2017 FINAL  Final    Studies/Results: US Renal  Result Date: 10/22/2017 CLINICAL DATA:  Acute renal failure. EXAM: RENAL / URINARY TRACT ULTRASOUND COMPLETE COMPARISON:  No prior. FINDINGS: Right Kidney: Length: 10.4 cm. Mild to moderate right hydronephrosis.  No focal mass lesion. Echogenicity normal. Left Kidney: Length: 8.7 cm. Mild left hydronephrosis. No focal mass lesion. Echogenicity normal. Bladder: Appears normal for degree of bladder distention. Mild ascites. IMPRESSION: 1. Mild to moderate right hydronephrosis. Mild left hydronephrosis. No bladder distention. 2.  Mild ascites. Electronically Signed   By: Marcello Moores  Register   On: 10/22/2017 12:35    Assessment/Plan:   Malignant hydronephrosis with acute renal failure.  Currently asymptomatic, renal function improving with hydration.    As she has had some improvement in her clinical status, with continued decreased creatinine, I am fine with letting her go home per medicine/oncology.  We can work out outpatient stent placement on Monday.   LOS: 3 days   Jorja Loa 10/24/2017, 8:51 AM That she is feeling fine.

## 2017-10-25 ENCOUNTER — Encounter (HOSPITAL_BASED_OUTPATIENT_CLINIC_OR_DEPARTMENT_OTHER): Payer: Self-pay | Admitting: Emergency Medicine

## 2017-10-25 ENCOUNTER — Other Ambulatory Visit: Payer: Self-pay

## 2017-10-25 ENCOUNTER — Other Ambulatory Visit: Payer: Self-pay | Admitting: Urology

## 2017-10-25 NOTE — Progress Notes (Signed)
SPOKE WITH: patient  RIDING HOME WITH: daughter  AM MEDICATIONS:bystolic, pantoprazole NPO STATUS: after midnight  LABS: current labs,EKG, CXR in epic and in chart  COMMENTS/CONCERNS: N/A ICE,;RN, BSN.

## 2017-10-28 ENCOUNTER — Ambulatory Visit (HOSPITAL_BASED_OUTPATIENT_CLINIC_OR_DEPARTMENT_OTHER): Payer: Medicare Other | Admitting: Anesthesiology

## 2017-10-28 ENCOUNTER — Encounter (HOSPITAL_BASED_OUTPATIENT_CLINIC_OR_DEPARTMENT_OTHER)
Admission: RE | Disposition: A | Discharge status: Home / Self Care | Payer: Self-pay | Source: Ambulatory Visit | Attending: Urology

## 2017-10-28 ENCOUNTER — Telehealth: Payer: Self-pay | Admitting: *Deleted

## 2017-10-28 ENCOUNTER — Ambulatory Visit (HOSPITAL_BASED_OUTPATIENT_CLINIC_OR_DEPARTMENT_OTHER)
Admission: RE | Admit: 2017-10-28 | Discharge: 2017-10-28 | Disposition: A | Discharge status: Home / Self Care | Payer: Medicare Other | Source: Ambulatory Visit | Attending: Urology | Admitting: Urology

## 2017-10-28 ENCOUNTER — Encounter (HOSPITAL_BASED_OUTPATIENT_CLINIC_OR_DEPARTMENT_OTHER): Payer: Self-pay

## 2017-10-28 ENCOUNTER — Ambulatory Visit (HOSPITAL_COMMUNITY): Payer: Medicare Other

## 2017-10-28 DIAGNOSIS — C7919 Secondary malignant neoplasm of other urinary organs: Secondary | ICD-10-CM | POA: Insufficient documentation

## 2017-10-28 DIAGNOSIS — N1330 Unspecified hydronephrosis: Secondary | ICD-10-CM | POA: Diagnosis present

## 2017-10-28 DIAGNOSIS — E785 Hyperlipidemia, unspecified: Secondary | ICD-10-CM | POA: Diagnosis not present

## 2017-10-28 DIAGNOSIS — N1339 Other hydronephrosis: Secondary | ICD-10-CM | POA: Diagnosis not present

## 2017-10-28 DIAGNOSIS — N131 Hydronephrosis with ureteral stricture, not elsewhere classified: Secondary | ICD-10-CM

## 2017-10-28 DIAGNOSIS — Z87891 Personal history of nicotine dependence: Secondary | ICD-10-CM | POA: Insufficient documentation

## 2017-10-28 DIAGNOSIS — C182 Malignant neoplasm of ascending colon: Secondary | ICD-10-CM | POA: Diagnosis not present

## 2017-10-28 DIAGNOSIS — Z90710 Acquired absence of both cervix and uterus: Secondary | ICD-10-CM | POA: Insufficient documentation

## 2017-10-28 DIAGNOSIS — Z885 Allergy status to narcotic agent status: Secondary | ICD-10-CM | POA: Insufficient documentation

## 2017-10-28 DIAGNOSIS — I129 Hypertensive chronic kidney disease with stage 1 through stage 4 chronic kidney disease, or unspecified chronic kidney disease: Secondary | ICD-10-CM | POA: Insufficient documentation

## 2017-10-28 DIAGNOSIS — Z800 Family history of malignant neoplasm of digestive organs: Secondary | ICD-10-CM | POA: Insufficient documentation

## 2017-10-28 DIAGNOSIS — Z888 Allergy status to other drugs, medicaments and biological substances status: Secondary | ICD-10-CM | POA: Insufficient documentation

## 2017-10-28 DIAGNOSIS — K219 Gastro-esophageal reflux disease without esophagitis: Secondary | ICD-10-CM | POA: Diagnosis not present

## 2017-10-28 DIAGNOSIS — R609 Edema, unspecified: Secondary | ICD-10-CM | POA: Diagnosis not present

## 2017-10-28 DIAGNOSIS — N132 Hydronephrosis with renal and ureteral calculous obstruction: Secondary | ICD-10-CM

## 2017-10-28 DIAGNOSIS — Z8249 Family history of ischemic heart disease and other diseases of the circulatory system: Secondary | ICD-10-CM | POA: Diagnosis not present

## 2017-10-28 DIAGNOSIS — R7303 Prediabetes: Secondary | ICD-10-CM | POA: Diagnosis not present

## 2017-10-28 DIAGNOSIS — H3530 Unspecified macular degeneration: Secondary | ICD-10-CM | POA: Diagnosis not present

## 2017-10-28 DIAGNOSIS — Z882 Allergy status to sulfonamides status: Secondary | ICD-10-CM | POA: Insufficient documentation

## 2017-10-28 DIAGNOSIS — N189 Chronic kidney disease, unspecified: Secondary | ICD-10-CM | POA: Diagnosis not present

## 2017-10-28 DIAGNOSIS — Z79899 Other long term (current) drug therapy: Secondary | ICD-10-CM | POA: Diagnosis not present

## 2017-10-28 HISTORY — DX: Complete loss of teeth, unspecified cause, unspecified class: K08.109

## 2017-10-28 HISTORY — DX: Personal history of other diseases of urinary system: Z87.448

## 2017-10-28 HISTORY — DX: Complete loss of teeth, unspecified cause, unspecified class: Z97.2

## 2017-10-28 HISTORY — DX: Prediabetes: R73.03

## 2017-10-28 HISTORY — PX: CYSTOSCOPY WITH RETROGRADE PYELOGRAM, URETEROSCOPY AND STENT PLACEMENT: SHX5789

## 2017-10-28 LAB — BASIC METABOLIC PANEL
Anion gap: 9 (ref 5–15)
BUN: 29 mg/dL — ABNORMAL HIGH (ref 6–20)
CHLORIDE: 103 mmol/L (ref 101–111)
CO2: 20 mmol/L — AB (ref 22–32)
Calcium: 8 mg/dL — ABNORMAL LOW (ref 8.9–10.3)
Creatinine, Ser: 1.73 mg/dL — ABNORMAL HIGH (ref 0.44–1.00)
GFR calc non Af Amer: 26 mL/min — ABNORMAL LOW (ref >60)
GFR, EST AFRICAN AMERICAN: 31 mL/min — AB (ref >60)
GLUCOSE: 98 mg/dL (ref 65–99)
Potassium: 4.5 mmol/L (ref 3.5–5.1)
Sodium: 132 mmol/L — ABNORMAL LOW (ref 135–145)

## 2017-10-28 SURGERY — CYSTOURETEROSCOPY, WITH RETROGRADE PYELOGRAM AND STENT INSERTION
Anesthesia: General | Site: Perineum | Laterality: Bilateral

## 2017-10-28 MED ORDER — FENTANYL CITRATE (PF) 100 MCG/2ML IJ SOLN
INTRAMUSCULAR | Status: AC
  Filled 2017-10-28: qty 2

## 2017-10-28 MED ORDER — FENTANYL CITRATE (PF) 100 MCG/2ML IJ SOLN
INTRAMUSCULAR | Status: DC | PRN
  Administered 2017-10-28 (×2): 25 ug via INTRAVENOUS
  Administered 2017-10-28: 50 ug via INTRAVENOUS

## 2017-10-28 MED ORDER — LIDOCAINE 2% (20 MG/ML) 5 ML SYRINGE
INTRAMUSCULAR | Status: DC | PRN
  Administered 2017-10-28: 40 mg via INTRAVENOUS

## 2017-10-28 MED ORDER — DEXAMETHASONE SODIUM PHOSPHATE 10 MG/ML IJ SOLN
INTRAMUSCULAR | Status: DC | PRN
  Administered 2017-10-28: 5 mg via INTRAVENOUS

## 2017-10-28 MED ORDER — SODIUM CHLORIDE 0.9 % IR SOLN
Status: DC | PRN
  Administered 2017-10-28: 3000 mL

## 2017-10-28 MED ORDER — OXYBUTYNIN CHLORIDE 5 MG PO TABS
ORAL_TABLET | ORAL | Status: AC
  Filled 2017-10-28: qty 1

## 2017-10-28 MED ORDER — IOHEXOL 300 MG/ML  SOLN
INTRAMUSCULAR | Status: DC | PRN
  Administered 2017-10-28: 11 mL

## 2017-10-28 MED ORDER — OXYBUTYNIN CHLORIDE 5 MG PO TABS
5.0000 mg | ORAL_TABLET | Freq: Three times a day (TID) | ORAL | Status: AC
  Administered 2017-10-28: 5 mg via ORAL
  Filled 2017-10-28: qty 1

## 2017-10-28 MED ORDER — DEXAMETHASONE SODIUM PHOSPHATE 10 MG/ML IJ SOLN
INTRAMUSCULAR | Status: AC
  Filled 2017-10-28: qty 1

## 2017-10-28 MED ORDER — LIDOCAINE 2% (20 MG/ML) 5 ML SYRINGE
INTRAMUSCULAR | Status: AC
  Filled 2017-10-28: qty 5

## 2017-10-28 MED ORDER — PROPOFOL 500 MG/50ML IV EMUL
INTRAVENOUS | Status: AC
  Filled 2017-10-28: qty 50

## 2017-10-28 MED ORDER — PROPOFOL 10 MG/ML IV BOLUS
INTRAVENOUS | Status: DC | PRN
  Administered 2017-10-28: 100 mg via INTRAVENOUS

## 2017-10-28 MED ORDER — FENTANYL CITRATE (PF) 100 MCG/2ML IJ SOLN
25.0000 ug | INTRAMUSCULAR | Status: DC | PRN
  Filled 2017-10-28: qty 1

## 2017-10-28 MED ORDER — CEFAZOLIN SODIUM-DEXTROSE 2-4 GM/100ML-% IV SOLN
2.0000 g | INTRAVENOUS | Status: AC
  Administered 2017-10-28: 2 g via INTRAVENOUS
  Filled 2017-10-28: qty 100

## 2017-10-28 MED ORDER — METOCLOPRAMIDE HCL 5 MG/ML IJ SOLN
10.0000 mg | Freq: Once | INTRAMUSCULAR | Status: DC | PRN
  Filled 2017-10-28: qty 2

## 2017-10-28 MED ORDER — ONDANSETRON HCL 4 MG/2ML IJ SOLN
INTRAMUSCULAR | Status: DC | PRN
  Administered 2017-10-28: 4 mg via INTRAVENOUS

## 2017-10-28 MED ORDER — OXYBUTYNIN CHLORIDE 5 MG PO TABS: 5.0000 mg | ORAL_TABLET | Freq: Three times a day (TID) | ORAL | 1 refills | Status: AC | PRN

## 2017-10-28 MED ORDER — ONDANSETRON HCL 4 MG/2ML IJ SOLN
INTRAMUSCULAR | Status: AC
  Filled 2017-10-28: qty 2

## 2017-10-28 MED ORDER — SODIUM CHLORIDE 0.9 % IV SOLN
INTRAVENOUS | Status: DC
  Administered 2017-10-28: 09:00:00 via INTRAVENOUS
  Filled 2017-10-28: qty 1000

## 2017-10-28 MED ORDER — CEPHALEXIN 250 MG PO CAPS: 250.0000 mg | ORAL_CAPSULE | Freq: Two times a day (BID) | ORAL | 0 refills | Status: DC

## 2017-10-28 MED ORDER — MEPERIDINE HCL 25 MG/ML IJ SOLN
6.2500 mg | INTRAMUSCULAR | Status: DC | PRN
  Filled 2017-10-28: qty 1

## 2017-10-28 MED ORDER — PROPOFOL 10 MG/ML IV BOLUS
INTRAVENOUS | Status: AC
  Filled 2017-10-28: qty 20

## 2017-10-28 MED ORDER — KETOROLAC TROMETHAMINE 30 MG/ML IJ SOLN
INTRAMUSCULAR | Status: AC
  Filled 2017-10-28: qty 1

## 2017-10-28 SURGICAL SUPPLY — 29 items
BAG DRAIN URO-CYSTO SKYTR STRL (DRAIN) ×2 IMPLANT
BAG DRN UROCATH (DRAIN) ×1
BASKET ZERO TIP NITINOL 2.4FR (BASKET) IMPLANT
BSKT STON RTRVL ZERO TP 2.4FR (BASKET)
CATH INTERMIT  6FR 70CM (CATHETERS) ×2 IMPLANT
CATH URET 5FR 28IN CONE TIP (BALLOONS)
CATH URET 5FR 70CM CONE TIP (BALLOONS) IMPLANT
CLOTH BEACON ORANGE TIMEOUT ST (SAFETY) ×2 IMPLANT
ELECT REM PT RETURN 9FT ADLT (ELECTROSURGICAL)
ELECTRODE REM PT RTRN 9FT ADLT (ELECTROSURGICAL) IMPLANT
FIBER LASER FLEXIVA 365 (UROLOGICAL SUPPLIES) IMPLANT
FIBER LASER TRAC TIP (UROLOGICAL SUPPLIES) IMPLANT
GLOVE BIO SURGEON STRL SZ 6.5 (GLOVE) ×1 IMPLANT
GLOVE BIO SURGEON STRL SZ8 (GLOVE) ×2 IMPLANT
GLOVE BIOGEL PI IND STRL 6.5 (GLOVE) IMPLANT
GLOVE BIOGEL PI INDICATOR 6.5 (GLOVE) ×2
GOWN STRL REUS W/ TWL XL LVL3 (GOWN DISPOSABLE) ×1 IMPLANT
GOWN STRL REUS W/TWL XL LVL3 (GOWN DISPOSABLE) ×2
GUIDEWIRE ANG ZIPWIRE 038X150 (WIRE) IMPLANT
GUIDEWIRE STR DUAL SENSOR (WIRE) IMPLANT
INFUSOR MANOMETER BAG 3000ML (MISCELLANEOUS) ×1 IMPLANT
IV NS IRRIG 3000ML ARTHROMATIC (IV SOLUTION) ×3 IMPLANT
KIT TURNOVER CYSTO (KITS) ×2 IMPLANT
MANIFOLD NEPTUNE II (INSTRUMENTS) IMPLANT
NS IRRIG 500ML POUR BTL (IV SOLUTION) ×2 IMPLANT
PACK CYSTO (CUSTOM PROCEDURE TRAY) ×2 IMPLANT
SHEATH ACCESS URETERAL 38CM (SHEATH) IMPLANT
STENT URET 6FRX24 CONTOUR (STENTS) ×2 IMPLANT
TUBE CONNECTING 12X1/4 (SUCTIONS) IMPLANT

## 2017-10-28 NOTE — Telephone Encounter (Signed)
Please let pt to restart spironnolactone at same dose she was on. Her weight gain and edema are likely related to the IVF she recently received. Thanks   Truitt Merle MD

## 2017-10-28 NOTE — H&P (Signed)
H&P  Chief Complaint: Blocked kidneys  History of Present Illness: Janet Barnes is a 84 y.o. year old female presenting at this time for cystoscopy, retrogrades and double-J stent placements bilaterally for new onset bilateral  malignant hydronephrosis.  She has metastatic colon cancer.  She was recently admitted a week ago with malaise and dehydration.  She was found to have new onset malignant hydronephrosis bilaterally.  She presents at this time for elective stent placement.  Past Medical History:  Diagnosis Date  . Anemia    iroin infusion - 07/2017   . Blood transfusion without reported diagnosis    IV Feraheme  . Cancer [facility])    colon cancer ; Malignant neoplasm of ascending colon; oncologist Dr. Burr Medico  dx 08-2017  . Chronic kidney disease   . Edema    in feet started on 10/13/17- patient stated she elevated feet and went away, call into PCP- Dr Karlton Lemon ; LE [place] on 10-23-17 neg for DVT  . Full dentures   . GERD (gastroesophageal reflux disease)   . History of acute renal failure    admission 10-21-17   . Hyperlipidemia   . Hypertension   . Macular degeneration   . Macular degeneration    receives eye injections every 6 months by Dr Ricki Miller at [facility]  . PONV (postoperative nausea and vomiting)   . Pre-diabetes   . Retinal break of left eye 2013    Past Surgical History:  Procedure Laterality Date  . ABDOMINAL HYSTERECTOMY  1977   partial   . PORTACATH PLACEMENT N/A 10/16/2017   Procedure: INSERTION PORT-A-CATH;  Surgeon: Ileana Roup, MD;  Location: [facility] ORS;  Service: General;  Laterality: N/A;    Home Medications:  Medications Prior to Admission  Medication Sig Dispense Refill  . acetaminophen (TYLENOL) 500 MG tablet Take 500 mg by mouth every 6 (six) hours as needed for moderate pain or headache.    . allopurinol (ZYLOPRIM) 300 MG tablet Take 1 tablet (300 mg total) by mouth daily. 30 tablet 1  . Cholecalciferol (VITAMIN D) 2000 UNITS tablet Take 2,000  Units by mouth daily.    . fenofibrate 160 MG tablet Take 160 mg by mouth daily.     Marland Kitchen lidocaine-prilocaine (EMLA) cream Apply to affected area once 30 g 3  . Nebivolol HCl (BYSTOLIC) 20 MG TABS Take 20 mg by mouth daily.     . ondansetron (ZOFRAN) 8 MG tablet Take 1 tablet (8 mg total) by mouth 2 (two) times daily as needed for refractory nausea / vomiting. Start on day 3 after chemotherapy. 30 tablet 1  . pantoprazole (PROTONIX) 40 MG tablet Take 1 tablet (40 mg total) by mouth daily. (Patient taking differently: Take 40 mg by mouth as needed. ) 30 tablet 1  . polyethylene glycol (MIRALAX / GLYCOLAX) packet Take 17 g by mouth daily as needed for moderate constipation.    . Polyethylene Glycol 400 (BLINK TEARS OP) Place 1 drop into both eyes daily as needed (for dry eyes).    . prochlorperazine (COMPAZINE) 10 MG tablet Take 1 tablet (10 mg total) by mouth every 6 (six) hours as needed (Nausea or vomiting). 30 tablet 1    Allergies:  Allergies  Allergen Reactions  . Feraheme [Ferumoxytol] Shortness Of Breath and Other (See Comments)    Dyspnea 30 min after infusion (happened after 2nd infusion only)  . Codeine Nausea Only  . Sulfur Other (See Comments)    Abdominal burning  Family History  Problem Relation Age of Onset  . Colon cancer Mother 71       colon   . Heart disease Father   . Cancer Sister   . Pancreatic cancer Sister 43       pancreatic   . Rectal cancer Neg Hx   . Stomach cancer Neg Hx   . Liver cancer Neg Hx     Social History:  reports that she quit smoking about 28 years ago. Her smoking use included cigarettes. She has a 2.50 pack-year smoking history. she has never used smokeless tobacco. She reports that she does not drink alcohol or use drugs.  ROS: A complete review of systems was performed.  All systems are negative except for pertinent findings as noted.  Physical Exam:  Vital signs in last 24 hours: Temp:  [97.9 F (36.6 C)] 97.9 F (36.6 C) (03/04  0855) Pulse Rate:  [67] 67 (03/04 0855) Resp:  [17] 17 (03/04 0855) BP: (133)/(56) 133/56 (03/04 0855) SpO2:  [99 %] 99 % (03/04 0855) Weight:  [180 lb 9.6 oz (81.9 kg)] 180 lb 9.6 oz (81.9 kg) (03/04 0855) General:  Alert and oriented, No acute distress HEENT: Normocephalic, atraumatic Neck: No JVD or lymphadenopathy Cardiovascular: Regular rate and rhythm Lungs: Clear bilaterally Abdomen: Soft, nontender, nondistended, no abdominal masses Back: No CVA tenderness Extremities: No edema Neurologic: Grossly intact  Laboratory Data:  No results found for this or any previous visit (from the past 24 hour(s)). No results found for this or any previous visit (from the past 240 hour(s)). Creatinine: Recent Labs    10/21/17 2118 10/22/17 0437 10/23/17 0500 10/24/17 0440  CREATININE 2.44* 2.27* 1.99* 1.90*    Radiologic Imaging: No results found.  Impression/Assessment:  Bilateral hydronephrosis  Plan:  Cystoscopy, bilateral retrograde ureteral pyelograms, bilateral double-J stent placement.  Lillette Boxer Eltha Tingley 10/28/2017, 11:02 AM  Lillette Boxer. Johnathan Heskett MD

## 2017-10-28 NOTE — Discharge Instructions (Signed)
Urethral Dilation, Care After Refer to this sheet in the next few weeks. These instructions provide you with information about caring for yourself after your procedure. Your health care provider may also give you more specific instructions. Your treatment has been planned according to current medical practices, but problems sometimes occur. Call your health care provider if you have any problems or questions after your procedure. What can I expect after the procedure? After the procedure, it is common to have: Burning pain when urinating. Blood in your urine. A need to urinate frequently.  Follow these instructions at home: Medicines Take over-the-counter and prescription medicines only as told by your health care provider. If you were prescribed antibiotic medicine, take it as told by your health care provider. Do not stop taking the antibiotic even if you start to feel better. Driving Do not drive or operate heavy machinery while taking prescription pain medicine. Do not drive for 24 hours if you received a medicine to help you relax (sedative) during your procedure. General instructions  If you were sent home with a catheter, follow your health care provider's instructions about how and when to use it. Drink enough fluid to keep your urine clear or pale yellow. Return to your normal activities as told by your health care provider. Ask your health care provider what activities are safe for you. Keep all follow-up visits as told by your health care provider. This is important. Contact a health care provider if: Your urine is cloudy and smells bad. You develop new bleeding when you urinate. You pass blood clots when you urinate. You have pain that does not get better with medicine. Get help right away if: You develop new bleeding that does not stop. You cannot pass urine. You have a fever. You have swelling, bruising, or discoloration of your genital area. This includes the penis,  scrotum, and inner thighs for men, and the outer genital organs (vulva) and inner thighs for women. This information is not intended to replace advice given to you by your health care provider. Make sure you discuss any questions you have with your health care provider. Document Released: 09/09/2015 Document Revised: 01/19/2016 Document Reviewed: 07/31/2015 Elsevier Interactive Patient Education  2018 [place] [facility] Care Instructions  Activity: Get plenty of rest for the remainder of the day. A responsible individual must stay with you for 24 hours following the procedure.  For the next 24 hours, DO NOT: -Drive a car -Paediatric nurse -Drink alcoholic beverages -Take any medication unless instructed by your physician -Make any legal decisions or sign important papers.  Meals: Start with liquid foods such as gelatin or soup. Progress to regular foods as tolerated. Avoid greasy, spicy, heavy foods. If nausea and/or vomiting occur, drink only clear liquids until the nausea and/or vomiting subsides. Call your physician if vomiting continues.  Special Instructions/Symptoms: Your throat may feel dry or sore from the anesthesia or the breathing tube placed in your throat during surgery. If this causes discomfort, gargle with warm salt water. The discomfort should disappear within 24 hours.  If you had a scopolamine patch placed behind your ear for the management of post- operative nausea and/or vomiting:  1. The medication in the patch is effective for 72 hours, after which it should be removed.  Wrap patch in a tissue and discard in the trash. Wash hands thoroughly with soap and water. 2. You may remove the patch earlier than 72 hours if you experience unpleasant side effects which  may include dry mouth, dizziness or visual disturbances. 3. Avoid touching the patch. Wash your hands with soap and water after contact with the patch.   1.  2. You may see some blood in the  urine and may have some burning with urination for 48-72 hours. You also may notice that you have to urinate more frequently or urgently after your procedure which is normal.  3. You should call should you develop an inability urinate, fever > 101, persistent nausea and vomiting that prevents you from eating or drinking to stay hydrated.  4. If you have a stent, you will likely urinate more frequently and urgently until the stent is removed and you may experience some discomfort/pain in the lower abdomen and flank especially when urinating. You may take pain medication prescribed to you if needed for pain. You may also intermittently have blood in the urine until the stent is removed.

## 2017-10-28 NOTE — Anesthesia Preprocedure Evaluation (Addendum)
Anesthesia Evaluation  Patient identified by MRN, date of birth, ID band Patient awake    Reviewed: Allergy & Precautions, NPO status , Patient's Chart, lab work & pertinent test results  History of Anesthesia Complications (+) PONV  Airway Mallampati: II  TM Distance: >3 FB Neck ROM: Full    Dental no notable dental hx. (+) Edentulous Upper, Edentulous Lower   Pulmonary former smoker,    Pulmonary exam normal breath sounds clear to auscultation       Cardiovascular hypertension, Pt. on medications and Pt. on home beta blockers Normal cardiovascular exam Rhythm:Regular Rate:Normal     Neuro/Psych negative neurological ROS  negative psych ROS   GI/Hepatic Neg liver ROS, GERD  Medicated and Controlled,  Endo/Other  negative endocrine ROS  Renal/GU negative Renal ROS  negative genitourinary   Musculoskeletal negative musculoskeletal ROS (+)   Abdominal   Peds negative pediatric ROS (+)  Hematology  (+) anemia ,   Anesthesia Other Findings   Reproductive/Obstetrics negative OB ROS                            Anesthesia Physical Anesthesia Plan  ASA: II  Anesthesia Plan: General   Post-op Pain Management:    Induction: Intravenous  PONV Risk Score and Plan: 4 or greater and Ondansetron, Dexamethasone and Treatment may vary due to age or medical condition  Airway Management Planned: LMA  Additional Equipment:   Intra-op Plan:   Post-operative Plan: Extubation in OR  Informed Consent: I have reviewed the patients History and Physical, chart, labs and discussed the procedure including the risks, benefits and alternatives for the proposed anesthesia with the patient or authorized representative who has indicated his/her understanding and acceptance.   Dental advisory given  Plan Discussed with: CRNA  Anesthesia Plan Comments:        Anesthesia Quick Evaluation

## 2017-10-28 NOTE — Telephone Encounter (Signed)
Received call from daughter Fraser Din requesting a call back from nurse or Dr. Burr Medico.  Called Abigail Butts back without answer.  Left message on voice mail asking for a call back from daughter. [facility]   Phone     [phone].

## 2017-10-28 NOTE — Anesthesia Procedure Notes (Signed)
Procedure Name: LMA Insertion Date/Time: 10/28/2017 11:45 AM Performed by: Montez Hageman, MD Pre-anesthesia Checklist: Patient identified, Emergency Drugs available, Suction available and Patient being monitored Patient Re-evaluated:Patient Re-evaluated prior to induction Oxygen Delivery Method: Circle system utilized Preoxygenation: Pre-oxygenation with 100% oxygen Induction Type: IV induction Ventilation: Mask ventilation without difficulty LMA: LMA inserted LMA Size: 4.0 Number of attempts: 1 Airway Equipment and Method: Bite block Placement Confirmation: positive ETCO2 Tube secured with: Tape Dental Injury: Teeth and Oropharynx as per pre-operative assessment

## 2017-10-28 NOTE — Telephone Encounter (Signed)
Spoke with Abigail Butts and informed her of Dr. Ernestina Penna instructions below.  Abigail Butts voiced understanding.

## 2017-10-28 NOTE — Anesthesia Postprocedure Evaluation (Signed)
Anesthesia Post Note  Patient: Janet Barnes  Procedure(s) Performed: CYSTOSCOPY WITH RETROGRADE PYELOGRAM,  AND STENT PLACEMENT (Bilateral Perineum)     Patient location during evaluation: PACU Anesthesia Type: General Level of consciousness: awake and alert Pain management: pain level controlled Vital Signs Assessment: post-procedure vital signs reviewed and stable Respiratory status: spontaneous breathing, nonlabored ventilation, respiratory function stable and patient connected to nasal cannula oxygen Cardiovascular status: blood pressure returned to baseline and stable Postop Assessment: no apparent nausea or vomiting Anesthetic complications: no    Last Vitals:  Vitals:   10/28/17 1230 10/28/17 1245  BP: 140/68 (!) 137/54  Pulse: 68 70  Resp: (!) 21 (!) 21  Temp:    SpO2: 100% 100%    Last Pain:  Vitals:   10/28/17 1215  TempSrc:   PainSc: 0-No pain                 Montez Hageman

## 2017-10-28 NOTE — Op Note (Signed)
Preoperative diagnosis: Bilateral hydroureteronephrosis secondary to malignancy  Postoperative diagnosis: Same  Principal procedure: Cystoscopy, bilateral retrograde ureteral pyelograms, fluoroscopic interpretation, placement of bilateral 6 French by 24 cm contour double-J stent without tether's  Surgeon: Joelyn Lover  Anesthesia: General with LMA  Complications: None  Specimen: None  Estimated blood loss: None  Indications: 84 year old female with progressive adenocarcinoma of the colon.  She recently presented with hydronephrosis bilaterally with renal insufficiency, at least partially attributable to her hydronephrosis.  She presents at this time for stent placement bilaterally for adequate urinary drainage.  I have discussed the procedure with the patient as well as both her son and daughter.  They understand the procedure as well as risks and complications which are minimal.  They desire to proceed.  Description of procedure: The patient was properly identified in the holding area.  She received preoperative IV antibiotics.  She was taken to the operating room where general anesthetic was administered with the LMA.  She was placed in the dorsolithotomy position.  Genitalia and perineum were prepped and draped.  Proper timeout was performed.  A 22 French panendoscope was advanced to the bladder which was inspected and found to be normal.  The right ureteral orifice was cannulated with an open-ended catheter, 6 [place].  Omnipaque was utilized for retrograde ureteropyelogram.  This revealed a normal ureter with a very tight stricture at the UPJ on the right with significant pyelocaliectasis.  Other than the pyelocaliectasis, no other abnormalities were noted in the right upper tract.  I then advanced a 0.038 inch sensor tip guidewire through the open-ended catheter and into the upper pole calyceal system where a curl was seen.  The open-ended catheter was then removed and the double-J stent was  placed.  Adequate proximal and distal curls were seen on the stent following removal of the guidewire.  The same procedure was performed on the left side.  The ureter was normal.  There was perhaps a very mild stricture in the mid ureter.  There was minimal hydroureteronephrosis with no filling defect seen in the calyceal system of the renal pelvis.  Following retrograde, guidewire was placed into the ureter through the open-ended catheter.  Open-ended catheter was removed, and a similar stent utilized on the right side was placed on the left as well.  Excellent proximal and distal curls were seen following removal of the guidewire.  At this point, the bladder was drained and the scope removed.  The patient tolerated procedure well.  She was awakened and then taken to the PACU in stable condition.

## 2017-10-28 NOTE — Transfer of Care (Signed)
  Last Vitals:  Vitals:   10/28/17 0855  BP: (!) 133/56  Pulse: 67  Resp: 17  Temp: 36.6 C  SpO2: 99%    Last Pain:  Vitals:   10/28/17 0855  TempSrc: Oral      Patients Stated Pain Goal: 5 (10/28/17 0932)  Immediate Anesthesia Transfer of Care Note  Patient: Janet Barnes  Procedure(s) Performed: Procedure(s) (LRB): CYSTOSCOPY WITH RETROGRADE PYELOGRAM,  AND STENT PLACEMENT (Bilateral)  Patient Location: PACU  Anesthesia Type: General  Level of Consciousness: awake, alert  and oriented  Airway & Oxygen Therapy: Patient Spontanous Breathing and Patient connected to nasal cannula oxygen  Post-op Assessment: Report given to PACU RN and Post -op Vital signs reviewed and stable  Post vital signs: Reviewed and stable  Complications: No apparent anesthesia complications

## 2017-10-28 NOTE — Telephone Encounter (Signed)
Daughter Abigail Butts returned call to nurse.  Per Abigail Butts, pt was off Spironolactone for a week as instructed by Dr. Burr Medico.  Since last week, pt had gained 10 lbs.    Pt had stent inserted today - able to void fine.   Abigail Butts stated pt's feet and ankles are swollen;  Calves swelling intermittently.  Pt does elevate legs with sitting. Abigail Butts wanted to know what Dr. Burr Medico would suggest.

## 2017-10-29 ENCOUNTER — Encounter (HOSPITAL_BASED_OUTPATIENT_CLINIC_OR_DEPARTMENT_OTHER): Payer: Self-pay | Admitting: Urology

## 2017-10-30 NOTE — Progress Notes (Signed)
[place]  [phone] [phone]  Clinic Follow up Note   Patient Care Team: Willey Blade, MD as PCP - General (Internal Medicine) Ileana Roup, MD as Consulting Physician (General Surgery) Truitt Merle, MD as Consulting Physician (Hematology)   Date of Service:  10/31/2017   CHIEF COMPLAINS: f/u metastatic colon cancer   SUMMARY OF ONCOLOGIC HISTORY: Oncology History   Cancer Staging Malignant neoplasm of ascending colon [facility]) Staging form: Colon and Rectum, AJCC 8th Edition - Clinical stage from 09/16/2017: Stage IVB (cTX, cNX, cM1b) - Signed by Truitt Merle, MD on 10/22/2017       Malignant neoplasm of ascending colon (Janet Barnes)   09/16/2017 Procedure    Colonoscopy per Dr. Hilarie Fredrickson - The digital rectal exam was normal. - A fungating and ulcerated partially obstructing large mass was found in the distal ascending colon. The mass was circumferential. Oozing was present. The pediatric colonoscopy would not tranverse this lesion. This was biopsied with a cold forceps for histology. Area distal to the mass was tattooed with injections of total 5 mL of Spot (carbon black). - Multiple small and large-mouthed diverticula were found in the sigmoid colon, descending colon and transverse colon. - Internal hemorrhoids were found during retroflexion. The hemorrhoids were small.  IMPRESSION - Malignant partially obstructing tumor in the distal ascending colon. Biopsied. Tattooed. - Moderate diverticulosis in the sigmoid colon, in the descending colon and in the transverse colon. - Internal hemorrhoids.      09/16/2017 Procedure    EGD IMPRESSION - Normal esophagus. - Erythematous mucosa in the antrum. Biopsied. - Normal examined duodenum. Biopsied.        09/17/2017 Imaging    CT CAP IMPRESSION: 1. Circumferential mass of the ascending colon extending over a 5.7 cm length associated with prominent luminal narrowing and surrounding edema  as well as surrounding tumor nodularity. Adenopathy in the lower neck, chest, upper abdomen, retroperitoneum, and pelvis with scattered subcutaneous nodules in the chest, abdomen, and pelvis; a suspected intramuscular metastatic lesion along the right gluteus maximus; and a few soft tissue nodules in the omentum. This is an unusual pattern for metastatic spread of colon adenocarcinoma, and while I cannot be certain that the subcutaneous nodules represent active tumor, the unusual distribution does raise the possibility of melanoma or lymphoma with colon involvement, or some combination of malignancies. Small amount of ascites in the right paracolic gutter and pelvis. 2. Hypodensity along the falciform ligament is probably from focal fatty infiltration. No definite hepatic metastatic lesion is seen. 3. Other imaging findings of potential clinical significance: Aortic Atherosclerosis (ICD10-I70.0). Mild airway plugging in the right lower lobe. Trace right pleural effusion. Descending and sigmoid colon diverticulosis.       09/25/2017 Initial Diagnosis    Malignant neoplasm of ascending colon (Janet Barnes)      09/26/2017 Pathology Results    Diagnosis Soft tissue, biopsy, anterior rt chest palpable subcutaneous nodule - POORLY DIFFERENTIATED CARCINOMA. Microscopic Comment Immunohistochemistry is performed and the tumor is positive with MOC-31, focally positive with CDX-2 and shows weak positivity with estrogen receptor. The tumor is negative with cytokeratin 7, cytokeratin 20, GATA-3, gross cystic disease fluid protein, progesterone receptor, Napsin A, thyroid transcription factor-1 and WT-1. The immunophenotype is consistent with adenocarcinoma, and the weak estrogen receptor positivity suggests breast or gynecologic primaries. Clinical correlation is essential.      10/17/2017 -  Chemotherapy    First line mFOLFOX, oxaliplatin was omitted for first cycle due to tumor lysis syndrome  10/21/2017 - 10/24/2017 Hospital Admission    Admit date: 10/21/17 Admission diagnosis: Due to worsening kidney fiunction, I admit pt to [facility] hospital today for further work up and management of her AKI under the hospitalist team care      10/28/2017 Surgery    Pt had a Cystoscopy with stent placement by Dr. Diona Fanti on 10/28/17.       HISTORY OF PRESENT ILLNESS (09/24/2017) Janet Barnes 84 y.o. female is here because of newly diagnosed cancer of the ascending colon. She was in her usual state of health until she developed bronchitis late December 2018. She was prescribed cough syrup but only took 2 doses because she noted decreased appetite and fatigue after taking it. Her bronchitis resolved but remained fatigued. She was due for her previously scheduled colonoscopy in January 2019 and developed stabbing left lower abdomina pain after eating with intermittent nausea and diarrhea. Labs performed at GI with significant anemia with low iron studies. Colonoscopy found a fungating and ulcerated partially obstructing large mass in the distal ascending colon which was circumferential. EGD with normal esophagus and duodenum but with erythematous mucosa in the gastric antrum which was biopsied. Staging CT CAP revealed cirfumferential mass in the ascending colon extending over a 5.7 cm length associated with prominent luminal narrowing and surrounding edema as well as surrounding tumor nodularity. Also notable for adenopathy in the lower neck, chest, upper abdomen, retroperitoneum and pelvis with scattered subcutaneous nodules in the chest, abdomen, and pelvis; there is suspected intramuscular metastatic lesion along the right gluteus maximus.   Past medical history is significant for anemia but has not previously been prescribed oral iron replacement and never received blood transfusion. She has history of GERD but had not been taking PPI lately, she has since restarted pantoprazole. Positive for HTN, kidney  disease, hyperlipidemia, macular degeneration, and retinal detachment. She has had hysterectomy but no other abdominal surgeries. Family history is strongly positive for colon cancer in her mother, pancreatic cancer in her sister. She has two children who are alive and healthy. She continues to be active in her community with church work and goes to the gym 4 times per week; she drives her self, lives alone, and is independent of all ADLs. Her children live out of town but are here for consult today. Daughter has ample time off with work if she should need more care during course of treatment.   Today she feels well overall. Denies abdominal pain at this time as it occurs mostly at night when she lies down. Does not want to take norco due to hydrocodone component. Reports 3 recent episodes of blood in stool without frank red blood per rectum. She received 2 doses of IV feraheme on 08/29/17 and 09/06/17, tolerated the first infusion but developed dyspnea, itching, nausea, and multiple subcutaneous skin nodules later in the day after second Feraheme infusion.  Denies vomiting, dysphagia, or weight loss. She is most concerned about multiple nontender skin nodules that appeared the day of her 2nd Feraheme infusion, one on her chin appears to be enlarging.  CURRENT THERAPY: first line chemo mFOLFOX and Avastin, started on 10/17/2017, reduced dose for cycle 2 due to AKI.  INTERVAL HISTORY:  ILEY DEIGNAN is here for a follow up and cycle 2 mFOLFOX.. She presents to the clinic today accompanied by her daughter-in-law and son on the phone.    Of note, since last visit she was discharged from hospital on 10/24/17 and had cystology and stent placement by  Dr. Diona Fanti on 10/28/17.   Today the pt notes when she first got home she had some dysuria and little amount of blood in urine. Both are now resolved.   On review of symptoms, pt notes she sits up all day and moves around the house. When she sits she elevates  her legs. She denies any current pain. Her appetite is low and is looking for medication to stimulate it. She has to make herself eat. Pt notes vaginal itching and burning since her stent placement. She denies discharge. Her lower legs have been swelling a lot since her hospitalization, L>R.  She denies nausea. She sleep well overall. She notes not feeling much change in her size of nodule. She notes to having dry mouth.    REVIEW OF SYSTEMS:   Constitutional: Denies fevers, chills or abnormal weight loss, (+) low appetite, minimal weight loss (+) fatigue  Eyes: Denies blurriness of vision Ears, nose, mouth, throat, and face: Denies mucositis or sore throat (+) dry mouth  Respiratory: Denies cough, dyspnea or wheezes Cardiovascular: Denies palpitation, chest discomfort (+) lower extremity swelling, L>R Vaginal:(+) itching and burning, no discharge Gastrointestinal:  Denies nausea, heartburn or change in bowel habits Skin: negative  Lymphatics: Denies new lymphadenopathy or easy bruising Neurological:Denies numbness, tingling or new weaknesses Behavioral/Psych: Mood is stable, no new changes  All other systems were reviewed with the patient and are negative.  MEDICAL HISTORY:  Past Medical History:  Diagnosis Date  . Anemia    iroin infusion - 07/2017   . Blood transfusion without reported diagnosis    IV Feraheme  . Cancer [facility])    colon cancer ; Malignant neoplasm of ascending colon; oncologist Dr. Burr Medico  dx 08-2017  . Chronic kidney disease   . Edema    in feet started on 10/13/17- patient stated she elevated feet and went away, call into PCP- Dr Karlton Lemon ; LE [place] on 10-23-17 neg for DVT  . Full dentures   . GERD (gastroesophageal reflux disease)   . History of acute renal failure    admission 10-21-17   . Hyperlipidemia   . Hypertension   . Macular degeneration   . Macular degeneration    receives eye injections every 6 months by Dr Ricki Miller at [facility]  . PONV (postoperative  nausea and vomiting)   . Pre-diabetes   . Retinal break of left eye 2013    SURGICAL HISTORY: Past Surgical History:  Procedure Laterality Date  . ABDOMINAL HYSTERECTOMY  1977   partial   . CYSTOSCOPY WITH RETROGRADE PYELOGRAM, URETEROSCOPY AND STENT PLACEMENT Bilateral 10/28/2017   Procedure: CYSTOSCOPY WITH RETROGRADE PYELOGRAM,  AND STENT PLACEMENT;  Surgeon: Franchot Gallo, MD;  Location: [facility];  Service: Urology;  Laterality: Bilateral;  . PORTACATH PLACEMENT N/A 10/16/2017   Procedure: INSERTION PORT-A-CATH;  Surgeon: Ileana Roup, MD;  Location: [facility];  Service: General;  Laterality: N/A;    I have reviewed the social history and family history with the patient and they are unchanged from previous note.  ALLERGIES:  is allergic to feraheme [ferumoxytol]; codeine; and sulfur.  MEDICATIONS:  Current Outpatient Medications  Medication Sig Dispense Refill  . acetaminophen (TYLENOL) 500 MG tablet Take 500 mg by mouth every 6 (six) hours as needed for moderate pain or headache.    . allopurinol (ZYLOPRIM) 300 MG tablet Take 1 tablet (300 mg total) by mouth daily. 30 tablet 1  . cephALEXin (KEFLEX) 250 MG capsule Take 1 capsule (250 mg  total) by mouth 2 (two) times daily. 6 capsule 0  . Cholecalciferol (VITAMIN D) 2000 UNITS tablet Take 2,000 Units by mouth daily.    Marland Kitchen dronabinol (MARINOL) 2.5 MG capsule Take 1 capsule (2.5 mg total) by mouth 2 (two) times daily before a meal. 60 capsule 1  . fenofibrate 160 MG tablet Take 160 mg by mouth daily.     . furosemide (LASIX) 20 MG tablet Take 1 tablet (20 mg total) by mouth every other day. 20 tablet 0  . lidocaine-prilocaine (EMLA) cream Apply to affected area once 30 g 3  . Nebivolol HCl (BYSTOLIC) 20 MG TABS Take 20 mg by mouth daily.     . ondansetron (ZOFRAN) 8 MG tablet Take 1 tablet (8 mg total) by mouth 2 (two) times daily as needed for refractory nausea / vomiting. Start on day 3 after chemotherapy.  30 tablet 1  . oxybutynin (DITROPAN) 5 MG tablet Take 1 tablet (5 mg total) by mouth every 8 (eight) hours as needed for bladder spasms. 60 tablet 1  . pantoprazole (PROTONIX) 40 MG tablet Take 1 tablet (40 mg total) by mouth daily. (Patient taking differently: Take 40 mg by mouth as needed. ) 30 tablet 1  . polyethylene glycol (MIRALAX / GLYCOLAX) packet Take 17 g by mouth daily as needed for moderate constipation.    . Polyethylene Glycol 400 (BLINK TEARS OP) Place 1 drop into both eyes daily as needed (for dry eyes).    . potassium chloride SA (K-DUR,KLOR-CON) 20 MEQ tablet Take 1 tablet (20 mEq total) by mouth 2 (two) times daily. 20 tablet 0  . prochlorperazine (COMPAZINE) 10 MG tablet Take 1 tablet (10 mg total) by mouth every 6 (six) hours as needed (Nausea or vomiting). 30 tablet 1   No current facility-administered medications for this visit.    Facility-Administered Medications Ordered in Other Visits  Medication Dose Route Frequency Provider Last Rate Last Dose  . alteplase (CATHFLO ACTIVASE) injection 2 mg  2 mg Intracatheter Once PRN Truitt Merle, MD      . heparin lock flush 100 unit/mL  500 Units Intracatheter Once PRN Truitt Merle, MD      . heparin lock flush 100 unit/mL  500 Units Intracatheter Once PRN Alla Feeling, NP      . sodium chloride flush (NS) 0.9 % injection 10 mL  10 mL Intracatheter PRN Alla Feeling, NP        PHYSICAL EXAMINATION: ECOG PERFORMANCE STATUS: 1 - Symptomatic but completely ambulatory Vitals:   10/31/17 0841  BP: (!) 133/51  Pulse: 65  Resp: (!) 24  Temp: 98.2 F (36.8 C)  TempSrc: Oral  SpO2: 100%  Weight: 178 lb (80.7 kg)  Height: '5\' 3"'  (1.6 m)    GENERAL:alert, no distress and comfortable SKIN: skin color, texture, turgor are normal, no rashes, (+) diffuse 1-2cm subcutaneous nodes at her left cheek, UE and trunk with mild skin redness (possible skin invasion) but no open wounds (+) 2 palpable nodules measuring 2.7x2cm behind her right  ear EYES: normal, Conjunctiva are pink and non-injected, sclera clear OROPHARYNX:no exudate, no erythema and lips, buccal mucosa, and tongue normal  NECK: supple, thyroid normal size, non-tender, without nodularity LYMPH:  (+) palpable cervical, supraclavicular, and axillarylymphadenopathy 1-2cm  LUNGS: clear to auscultation and percussion with normal breathing effort HEART: regular rate & rhythm and no murmurs and no lower extremity edema ABDOMEN:abdomen soft, non-tender and normal bowel sounds Musculoskeletal:no cyanosis of digits and no clubbing  NEURO: alert & oriented x 3 with fluent speech, no focal motor/sensory deficits  LABORATORY DATA:  I have reviewed the data as listed CBC Latest Ref Rng & Units 10/21/2017 10/18/2017  WBC 4.0 - 10.5 K/uL 12.5(H) 16.9(H)  Hemoglobin 12.0 - 15.0 g/dL - -  Hematocrit 36.0 - 46.0 % 28.1(L) 31.2(L)  Platelets 150 - 400 K/uL 244 352     CMP Latest Ref Rng & Units 10/21/2017  Glucose 65 - 99 mg/dL 122  BUN 6 - 20 mg/dL 66(H)  Creatinine 0.44 - 1.00 mg/dL 2.77(H)  Sodium 135 - 145 mmol/L 129(L)  Potassium 3.5 - 5.1 mmol/L 5.6(H)  Chloride 101 - 111 mmol/L 99  CO2 22 - 32 mmol/L 20(L)  Calcium 8.9 - 10.3 mg/dL 8.8  Total Protein 6.5 - 8.1 g/dL 6.0(L)  Total Bilirubin 0.3 - 1.2 mg/dL 1.3(H)  Alkaline Phos 38 - 126 U/L 97  AST 15 - 41 U/L 29  ALT 14 - 54 U/L 23      RADIOGRAPHIC STUDIES: I have personally reviewed the radiological images as listed and agreed with the findings in the report.  No new images    ASSESSMENT & PLAN:  Janet Barnes is a pleasant 84 y.o. AA female with history of iron deficiency anemia and GERD now with colon mass, diffuse subcutaneous nodule and adenopathy   1. AKI secondary to tumor lysis syndrome and hydronephrosis -She was found to have acute renal failure with creatinine 2.26 when she came in for first cycle chemo on 10/17/17, uric acid was 12, consistent with tumor lysis syndrome. She received  rasburicase, hyperuricemia is resolved now. She is on allopurinol for prophylaxis. -She received IV fluids twice last, however her creatinine has been slowly trending up, 2.77 on 10/21/17.  She also has developed hypocalcemia, mild hyponatremia, I am concerned her tumor lysis syndrome is getting worse since we started chemo -Also may need renal ultrasound to rule out obstruction or other etiology  -I admitted pt to [facility] on 10/21/17 for further work up and management of her AKI under the hospitalist team care  -In hospital her Cr improved,  Hyperuricemia, Hyperkalemia,  Hyponatremia resolved. -Pt had a Cystoscopy with stent placement by Dr. Diona Fanti on 10/28/17.  -Since cystoscopy, she has experienced vaginal itching and burning. I recommend she follow up with surgeon.  -Pt was previously is on Spironolactone for edema. Since hospitalization her LE edema has worsened. If her Kidney function has improved, I will give her a few doses of Lasix and potassium to reduce her swelling.  -I strongly encouraged her to elevate her legs above her heart when sitting.  -She should follow up with her cardiologist soon.   2. Hyperbilirubinemia  -She has developed a new hyperbilirubinemia with total bilirubin 4.7 today, liver enzymes are slightly elevated. -I obtain a abdominal ultrasound today, which showed dilated common bile duct, 14 mm, no significant liver lesions. -I think her obstructive jaundice is likely related to her portal adenopathy. -I sent a message to her gastroenterologist Dr. Hilarie Fredrickson and Dr. Ardis Hughs to see if ERCP and stent placement is feasible.  3. Adenocarcinoma of ascending colon, with diffuse nodes and subcutaneous metastasis, cTxNxM1b, stage IVB -We previously reviewed her records, imaging, and pathology in detail.  -She presents with multiple subcutaneous skin nodules and adenopathy on CT; no evidence of metastasis in the liver -Her subcutaneous nodule biopsy from 10/16/17 showed poorly  differentiated carcinoma. I have discussed with pathologist Dr. Saralyn Pilar who compared the biopsy with her  initial colon mass biopsy, which showed very similar morphology.  This is consistent with metastatic colon cancer.  I have discussed this with patient and her son in details. -Unfortunately, her disease will likely not be curable at that point but treatable, she is elderly but in good physical condition and would likely be a good candidate for systemic chemotherapy.  -I recommended first-line chemotherapy with mFOLFOX, every 2 weeks, the goal of therapy is palliative, to prolong her life and hopefully improve her quality of life. -She started first line chemotherapy FOLFOX on 10/17/17, due to her tumor lysis syndrome and acute renal failure, oxaliplatin was held. -Her metastatic colon cancer appears to be very aggressive clinically, I suspect that she may have BRAF mutation.  -I have requested Foundation One test on her subcutaneous biopsy tumor tissue, to see if she is a candidate for targeted therapy or immunotherapy, I spoke with path -She has moderate fatigue and low appetite, secondary to chemo, will continue supportive care. -Upon exam today (10/31/17) her right side nodule seems stable or slightly larger. Will continue to clinically monitor nodule response to treatment.  -Due to significant hyperbilirubinemia, I held chemo today.   4. Anemia, Iron deficiency, and anemia of chronic disease  -She reports a history of anemia, I do not have distant records. -previously CBC at GI office with Hgb 8.5, normal MCV; Iron 18, saturation 4%; normal but low ferritin  -S/p 2 infusions IV Feraheme 1/3 and 1/11; she had acute shortness of breath, itching, nausea with second infusion -Her anemia has improved but not resolved after IV iron, indicating component of anemia of chronic disease, likely secondary to CKD and underline malignancy  -Hg at 8.9 today, will continue close monitoring  5. HTN, HL,  Chronic Kidney Disease stage III, GERD -Continue medication regimen for chronic conditions -f/u with PCP regularly  -We discussed the impact of chemotherapy on her blood pressure, renal function, etc.  Will monitor her closely during chemotherapy.  6. Goal of care discussion  -We again discussed the incurable nature of her cancer, and the overall poor prognosis, especially if she does not have good response to chemotherapy or progress on chemo -The patient understands the goal of care is palliative. -she is full code now   7.  Anorexia -Secondary to chemotherapy and underlying malignancy.  We discussed the option of appetite stimulant, she is interested.  I Discussed the benefits and side effects of Megace, Marinol, mirtazapine. She opted for Marinol, I called in today (10/31/17) to start at low dose 2.20m. If tolerable she can titrate up to 5.069m   8. Leg edema -Likely related to fluid overload during her recent hospitalization -We will start her on Lasix 20 mg every other day for a week, will take potassium 20 mEq with Lasix. -She knows to elevate her legs, and wear compression stockings  PLAN: -Abdominal ultrasound stat today, I discussed the results with pt and her daughter-in-law afterwards -Hold chemotherapy today due to significant hyperbilirubinemia -Prescribe Marinol, Lasix and potassium today  -I will ask GI to see if ERCP and stent placement is feasible -Was scheduled to follow-up next week and postpone the chemo to next week after ERCP   I spent 30 minutes counseling the patient face to face. The total time spent in the appointment was 40 minutes and more than 50% was on counseling and review of test results     YaTruitt MerleMD 10/31/2017   This document serves as a record of services personally performed  by Truitt Merle, MD. It was created on her behalf by Joslyn Devon, a trained medical scribe. The creation of this record is based on the scribe's personal observations and the  provider's statements to them.    I have reviewed the above documentation for accuracy and completeness, and I agree with the above.

## 2017-10-31 ENCOUNTER — Telehealth: Payer: Self-pay | Admitting: Hematology

## 2017-10-31 ENCOUNTER — Inpatient Hospital Stay: Payer: Medicare Other

## 2017-10-31 ENCOUNTER — Ambulatory Visit: Payer: Medicare Other

## 2017-10-31 ENCOUNTER — Encounter: Payer: Self-pay | Admitting: Hematology

## 2017-10-31 ENCOUNTER — Inpatient Hospital Stay: Payer: Medicare Other | Attending: Hematology | Admitting: Hematology

## 2017-10-31 ENCOUNTER — Telehealth: Payer: Self-pay | Admitting: *Deleted

## 2017-10-31 ENCOUNTER — Ambulatory Visit (HOSPITAL_COMMUNITY)
Admission: RE | Admit: 2017-10-31 | Discharge: 2017-10-31 | Disposition: A | Discharge status: Home / Self Care | Payer: Medicare Other | Source: Ambulatory Visit | Attending: Hematology | Admitting: Hematology

## 2017-10-31 VITALS — BP 133/51 | HR 65 | Temp 98.2°F | Resp 24 | Ht 63.0 in | Wt 178.0 lb

## 2017-10-31 DIAGNOSIS — E43 Unspecified severe protein-calorie malnutrition: Secondary | ICD-10-CM | POA: Diagnosis not present

## 2017-10-31 DIAGNOSIS — C182 Malignant neoplasm of ascending colon: Secondary | ICD-10-CM

## 2017-10-31 DIAGNOSIS — D638 Anemia in other chronic diseases classified elsewhere: Secondary | ICD-10-CM

## 2017-10-31 DIAGNOSIS — R188 Other ascites: Secondary | ICD-10-CM | POA: Insufficient documentation

## 2017-10-31 DIAGNOSIS — R630 Anorexia: Secondary | ICD-10-CM

## 2017-10-31 DIAGNOSIS — R609 Edema, unspecified: Secondary | ICD-10-CM

## 2017-10-31 DIAGNOSIS — N1330 Unspecified hydronephrosis: Secondary | ICD-10-CM | POA: Insufficient documentation

## 2017-10-31 DIAGNOSIS — G9341 Metabolic encephalopathy: Secondary | ICD-10-CM | POA: Diagnosis not present

## 2017-10-31 DIAGNOSIS — C770 Secondary and unspecified malignant neoplasm of lymph nodes of head, face and neck: Secondary | ICD-10-CM

## 2017-10-31 DIAGNOSIS — R531 Weakness: Secondary | ICD-10-CM | POA: Diagnosis not present

## 2017-10-31 DIAGNOSIS — E860 Dehydration: Secondary | ICD-10-CM | POA: Insufficient documentation

## 2017-10-31 DIAGNOSIS — N179 Acute kidney failure, unspecified: Secondary | ICD-10-CM

## 2017-10-31 DIAGNOSIS — Z7189 Other specified counseling: Secondary | ICD-10-CM | POA: Diagnosis not present

## 2017-10-31 DIAGNOSIS — N898 Other specified noninflammatory disorders of vagina: Secondary | ICD-10-CM

## 2017-10-31 DIAGNOSIS — E883 Tumor lysis syndrome: Secondary | ICD-10-CM | POA: Diagnosis not present

## 2017-10-31 DIAGNOSIS — C779 Secondary and unspecified malignant neoplasm of lymph node, unspecified: Secondary | ICD-10-CM | POA: Diagnosis not present

## 2017-10-31 DIAGNOSIS — Z95828 Presence of other vascular implants and grafts: Secondary | ICD-10-CM

## 2017-10-31 DIAGNOSIS — J90 Pleural effusion, not elsewhere classified: Secondary | ICD-10-CM | POA: Insufficient documentation

## 2017-10-31 DIAGNOSIS — D509 Iron deficiency anemia, unspecified: Secondary | ICD-10-CM

## 2017-10-31 DIAGNOSIS — I129 Hypertensive chronic kidney disease with stage 1 through stage 4 chronic kidney disease, or unspecified chronic kidney disease: Secondary | ICD-10-CM | POA: Diagnosis not present

## 2017-10-31 DIAGNOSIS — C792 Secondary malignant neoplasm of skin: Secondary | ICD-10-CM

## 2017-10-31 LAB — CBC WITH DIFFERENTIAL (CANCER CENTER ONLY)
Basophils Absolute: 0 10*3/uL (ref 0.0–0.1)
Basophils Relative: 0 %
Eosinophils Absolute: 0.1 10*3/uL (ref 0.0–0.5)
Eosinophils Relative: 2 %
HCT: 27.2 % — ABNORMAL LOW (ref 34.8–46.6)
HEMOGLOBIN: 8.9 g/dL — AB (ref 11.6–15.9)
LYMPHS ABS: 0.6 10*3/uL — AB (ref 0.9–3.3)
LYMPHS PCT: 11 %
MCH: 26.3 pg (ref 25.1–34.0)
MCHC: 32.7 g/dL (ref 31.5–36.0)
MCV: 80.2 fL (ref 79.5–101.0)
Monocytes Absolute: 0.4 10*3/uL (ref 0.1–0.9)
Monocytes Relative: 7 %
Neutro Abs: 4.3 10*3/uL (ref 1.5–6.5)
Neutrophils Relative %: 80 %
Platelet Count: 351 10*3/uL (ref 145–400)
RBC: 3.39 MIL/uL — AB (ref 3.70–5.45)
RDW: 22.1 % — ABNORMAL HIGH (ref 11.2–14.5)
WBC Count: 5.3 10*3/uL (ref 3.9–10.3)

## 2017-10-31 LAB — CMP (CANCER CENTER ONLY)
ALK PHOS: 497 U/L — AB (ref 40–150)
ALT: 52 U/L (ref 0–55)
AST: 72 U/L — ABNORMAL HIGH (ref 5–34)
Albumin: 2.2 g/dL — ABNORMAL LOW (ref 3.5–5.0)
Anion gap: 11 (ref 3–11)
BILIRUBIN TOTAL: 4.7 mg/dL — AB (ref 0.2–1.2)
BUN: 33 mg/dL — ABNORMAL HIGH (ref 7–26)
CALCIUM: 8.6 mg/dL (ref 8.4–10.4)
CO2: 18 mmol/L — ABNORMAL LOW (ref 22–29)
CREATININE: 1.48 mg/dL — AB (ref 0.60–1.10)
Chloride: 101 mmol/L (ref 98–109)
GFR, EST NON AFRICAN AMERICAN: 32 mL/min — AB (ref >60)
GFR, Est AFR Am: 37 mL/min — ABNORMAL LOW (ref >60)
Glucose, Bld: 92 mg/dL (ref 70–140)
Potassium: 4.6 mmol/L (ref 3.5–5.1)
Sodium: 130 mmol/L — ABNORMAL LOW (ref 136–145)
TOTAL PROTEIN: 5.8 g/dL — AB (ref 6.4–8.3)

## 2017-10-31 LAB — URIC ACID: URIC ACID, SERUM: 3.5 mg/dL (ref 2.6–7.4)

## 2017-10-31 MED ORDER — DRONABINOL 2.5 MG PO CAPS: 2.5000 mg | ORAL_CAPSULE | Freq: Two times a day (BID) | ORAL | 1 refills | Status: AC

## 2017-10-31 MED ORDER — HEPARIN SOD (PORK) LOCK FLUSH 100 UNIT/ML IV SOLN
500.0000 [IU] | Freq: Once | INTRAVENOUS | Status: AC | PRN
  Administered 2017-10-31: 500 [IU]
  Filled 2017-10-31: qty 5

## 2017-10-31 MED ORDER — SODIUM CHLORIDE 0.9% FLUSH
10.0000 mL | INTRAVENOUS | Status: DC | PRN
  Administered 2017-10-31: 10 mL
  Filled 2017-10-31: qty 10

## 2017-10-31 MED ORDER — FUROSEMIDE 20 MG PO TABS: 20.0000 mg | ORAL_TABLET | ORAL | 0 refills | Status: AC

## 2017-10-31 MED ORDER — POTASSIUM CHLORIDE CRYS ER 20 MEQ PO TBCR: 20.0000 meq | EXTENDED_RELEASE_TABLET | Freq: Two times a day (BID) | ORAL | 0 refills | Status: AC

## 2017-10-31 NOTE — Progress Notes (Signed)
Pt's Dronabinol cap 2.5MG  is approved through 05/03/2018.  Request Reference Number: [id number].

## 2017-10-31 NOTE — Progress Notes (Signed)
Spoke w/ pt's daughter and introduced myself as her Arboriculturist.  Unfortunately there aren't any foundations offering copay assistance for her Dx.  I offered the [place], went over what it covers, gave her an expense sheet and the income requirement.  She will check pt's financials to see if she qualifies and send them to me if she does.  She has my card for any questions or concerns she may have in the future.

## 2017-10-31 NOTE — Progress Notes (Signed)
Submitted auth request for Dronabinol today.  Status is pending.  °

## 2017-10-31 NOTE — Patient Instructions (Signed)
Implanted Port Home Guide An implanted port is a type of central line that is placed under the skin. Central lines are used to provide IV access when treatment or nutrition needs to be given through a person's veins. Implanted ports are used for long-term IV access. An implanted port may be placed because:  You need IV medicine that would be irritating to the small veins in your hands or arms.  You need long-term IV medicines, such as antibiotics.  You need IV nutrition for a long period.  You need frequent blood draws for lab tests.  You need dialysis.  Implanted ports are usually placed in the chest area, but they can also be placed in the upper arm, the abdomen, or the leg. An implanted port has two main parts:  Reservoir. The reservoir is round and will appear as a small, raised area under your skin. The reservoir is the part where a needle is inserted to give medicines or draw blood.  Catheter. The catheter is a thin, flexible tube that extends from the reservoir. The catheter is placed into a large vein. Medicine that is inserted into the reservoir goes into the catheter and then into the vein.  How will I care for my incision site? Do not get the incision site wet. Bathe or shower as directed by your health care provider. How is my port accessed? Special steps must be taken to access the port:  Before the port is accessed, a numbing cream can be placed on the skin. This helps numb the skin over the port site.  Your health care provider uses a sterile technique to access the port. ? Your health care provider must put on a mask and sterile gloves. ? The skin over your port is cleaned carefully with an antiseptic and allowed to dry. ? The port is gently pinched between sterile gloves, and a needle is inserted into the port.  Only "non-coring" port needles should be used to access the port. Once the port is accessed, a blood return should be checked. This helps ensure that the port  is in the vein and is not clogged.  If your port needs to remain accessed for a constant infusion, a clear (transparent) bandage will be placed over the needle site. The bandage and needle will need to be changed every week, or as directed by your health care provider.  Keep the bandage covering the needle clean and dry. Do not get it wet. Follow your health care provider's instructions on how to take a shower or bath while the port is accessed.  If your port does not need to stay accessed, no bandage is needed over the port.  What is flushing? Flushing helps keep the port from getting clogged. Follow your health care provider's instructions on how and when to flush the port. Ports are usually flushed with saline solution or a medicine called heparin. The need for flushing will depend on how the port is used.  If the port is used for intermittent medicines or blood draws, the port will need to be flushed: ? After medicines have been given. ? After blood has been drawn. ? As part of routine maintenance.  If a constant infusion is running, the port may not need to be flushed.  How long will my port stay implanted? The port can stay in for as long as your health care provider thinks it is needed. When it is time for the port to come out, surgery will be   done to remove it. The procedure is similar to the one performed when the port was put in. When should I seek immediate medical care? When you have an implanted port, you should seek immediate medical care if:  You notice a bad smell coming from the incision site.  You have swelling, redness, or drainage at the incision site.  You have more swelling or pain at the port site or the surrounding area.  You have a fever that is not controlled with medicine.  This information is not intended to replace advice given to you by your health care provider. Make sure you discuss any questions you have with your health care provider. Document  Released: 08/13/2005 Document Revised: 01/19/2016 Document Reviewed: 04/20/2013 Elsevier Interactive Patient Education  2017 [employer]  

## 2017-10-31 NOTE — Telephone Encounter (Signed)
Gave patient avs and calendar with appts per 3/7 los.  °

## 2017-10-31 NOTE — Telephone Encounter (Signed)
Late entry: Received call report of critical bilirubin 4.7. Dr. Burr Medico notified. Selinda Orion, Infusion RN notified as well.

## 2017-11-01 ENCOUNTER — Other Ambulatory Visit: Payer: Self-pay | Admitting: *Deleted

## 2017-11-01 ENCOUNTER — Telehealth: Payer: Self-pay | Admitting: *Deleted

## 2017-11-01 ENCOUNTER — Telehealth: Payer: Self-pay

## 2017-11-01 ENCOUNTER — Other Ambulatory Visit: Payer: Self-pay | Admitting: Hematology

## 2017-11-01 DIAGNOSIS — R17 Unspecified jaundice: Secondary | ICD-10-CM

## 2017-11-01 NOTE — Telephone Encounter (Signed)
-----   Message from Milus Banister, MD sent at 11/01/2017  7:57 AM EST -----  Evorn Gong, I should be able to take care of this on Thursday, March 14.  If it needs to be done sooner then we would have to look at whoever is covering biliary cases in the hospital to help.  It is a bit unusual to have jaundice from biliary obstruction without intrahepatic bile duct dilation and so I think more imaging information would be helpful.  Krista Blue, could you set up MRI with MRCP? We'll book the ERCP for next Thursday in the meantime.  Satine Hausner, Can you put her on for ERCP next Thursday, March 14.  thanks    ----- Message ----- From: Truitt Merle, MD Sent: 10/31/2017   2:16 PM To: Milus Banister, MD, Jerene Bears, MD  Jacqulyn Cane and Ulice Dash,  This 84 yo lady has recently diagnosed metastatic colon cancer, she now has significantly elevated TBIL 4.7 today, from normal one week ago, US showed dilated CBD 1.4cm. No known liver mets. 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 Could you review her CT and [place] to see if ERCP and stent placement is feasible? I had to cancel her chemo today, could you or your partner help me to get his done asap? Her cancer appears to be very aggressive, she has diffuse nodal and subcutaneous metastasis.   Thanks much,  Krista Blue

## 2017-11-01 NOTE — Telephone Encounter (Signed)
Spoke with son Araceli Bouche and informed him re: 1.      MRI scheduled for [facility]   11/04/17. 2.      If pt feels ok next Tues  3/12 ,  Keep follow up appt as scheduled. [place] phone number to Dr. Owens Loffler office for further questions and appt. 4.     A scheduler will contact him with appts for lab, office visit, and chemo  Either on  3/18  Or  3/19.  Schedule message sent. Araceli Bouche voiced understanding. [name]    Phone     [phone].

## 2017-11-04 ENCOUNTER — Telehealth: Payer: Self-pay | Admitting: Hematology

## 2017-11-04 ENCOUNTER — Other Ambulatory Visit: Payer: Self-pay | Admitting: Hematology

## 2017-11-04 ENCOUNTER — Ambulatory Visit (HOSPITAL_COMMUNITY)
Admission: RE | Admit: 2017-11-04 | Discharge: 2017-11-04 | Disposition: A | Discharge status: Home / Self Care | Payer: Medicare Other | Source: Ambulatory Visit | Attending: Hematology | Admitting: Hematology

## 2017-11-04 ENCOUNTER — Encounter (HOSPITAL_BASED_OUTPATIENT_CLINIC_OR_DEPARTMENT_OTHER): Payer: Self-pay | Admitting: Urology

## 2017-11-04 ENCOUNTER — Other Ambulatory Visit: Payer: Self-pay

## 2017-11-04 DIAGNOSIS — M799 Soft tissue disorder, unspecified: Secondary | ICD-10-CM | POA: Diagnosis not present

## 2017-11-04 DIAGNOSIS — R188 Other ascites: Secondary | ICD-10-CM | POA: Insufficient documentation

## 2017-11-04 DIAGNOSIS — K838 Other specified diseases of biliary tract: Secondary | ICD-10-CM | POA: Insufficient documentation

## 2017-11-04 DIAGNOSIS — R590 Localized enlarged lymph nodes: Secondary | ICD-10-CM | POA: Insufficient documentation

## 2017-11-04 DIAGNOSIS — R17 Unspecified jaundice: Secondary | ICD-10-CM | POA: Insufficient documentation

## 2017-11-04 DIAGNOSIS — J90 Pleural effusion, not elsewhere classified: Secondary | ICD-10-CM | POA: Insufficient documentation

## 2017-11-04 DIAGNOSIS — K8051 Calculus of bile duct without cholangitis or cholecystitis with obstruction: Secondary | ICD-10-CM | POA: Insufficient documentation

## 2017-11-04 NOTE — Telephone Encounter (Signed)
Spoke with patients son Araceli Bouche regarding appointments per 3/8 sch msg.  Son also had questions regarding medication.  In basket message sent to [facility] w/ Dr Burr Medico 3/11

## 2017-11-04 NOTE — Telephone Encounter (Signed)
ERCP scheduled, pt instructed and medications reviewed.  Patient instructions mailed to home.  Patient to call with any questions or concerns.  

## 2017-11-05 ENCOUNTER — Encounter (HOSPITAL_COMMUNITY): Payer: Self-pay | Admitting: Emergency Medicine

## 2017-11-05 ENCOUNTER — Inpatient Hospital Stay: Payer: Medicare Other

## 2017-11-05 ENCOUNTER — Inpatient Hospital Stay (HOSPITAL_BASED_OUTPATIENT_CLINIC_OR_DEPARTMENT_OTHER): Payer: Medicare Other | Admitting: Nurse Practitioner

## 2017-11-05 ENCOUNTER — Other Ambulatory Visit: Payer: Self-pay | Admitting: Hematology

## 2017-11-05 ENCOUNTER — Other Ambulatory Visit: Payer: Self-pay

## 2017-11-05 ENCOUNTER — Encounter: Payer: Self-pay | Admitting: Nurse Practitioner

## 2017-11-05 VITALS — BP 94/60 | HR 67 | Temp 98.0°F | Resp 22

## 2017-11-05 VITALS — BP 123/56

## 2017-11-05 DIAGNOSIS — Z7189 Other specified counseling: Secondary | ICD-10-CM

## 2017-11-05 DIAGNOSIS — I129 Hypertensive chronic kidney disease with stage 1 through stage 4 chronic kidney disease, or unspecified chronic kidney disease: Secondary | ICD-10-CM | POA: Diagnosis not present

## 2017-11-05 DIAGNOSIS — E883 Tumor lysis syndrome: Secondary | ICD-10-CM

## 2017-11-05 DIAGNOSIS — C770 Secondary and unspecified malignant neoplasm of lymph nodes of head, face and neck: Secondary | ICD-10-CM | POA: Diagnosis not present

## 2017-11-05 DIAGNOSIS — R630 Anorexia: Secondary | ICD-10-CM | POA: Diagnosis not present

## 2017-11-05 DIAGNOSIS — C792 Secondary malignant neoplasm of skin: Secondary | ICD-10-CM | POA: Diagnosis not present

## 2017-11-05 DIAGNOSIS — Z95828 Presence of other vascular implants and grafts: Secondary | ICD-10-CM

## 2017-11-05 DIAGNOSIS — N1330 Unspecified hydronephrosis: Secondary | ICD-10-CM

## 2017-11-05 DIAGNOSIS — R531 Weakness: Secondary | ICD-10-CM | POA: Diagnosis not present

## 2017-11-05 DIAGNOSIS — C182 Malignant neoplasm of ascending colon: Secondary | ICD-10-CM

## 2017-11-05 DIAGNOSIS — N179 Acute kidney failure, unspecified: Secondary | ICD-10-CM

## 2017-11-05 DIAGNOSIS — R17 Unspecified jaundice: Secondary | ICD-10-CM

## 2017-11-05 DIAGNOSIS — R609 Edema, unspecified: Secondary | ICD-10-CM

## 2017-11-05 LAB — CMP (CANCER CENTER ONLY)
ALBUMIN: 2.1 g/dL — AB (ref 3.5–5.0)
ALK PHOS: 591 U/L — AB (ref 40–150)
ALT: 66 U/L — AB (ref 0–55)
AST: 88 U/L — AB (ref 5–34)
Anion gap: 13 — ABNORMAL HIGH (ref 3–11)
BILIRUBIN TOTAL: 7.8 mg/dL — AB (ref 0.2–1.2)
BUN: 64 mg/dL — AB (ref 7–26)
CO2: 18 mmol/L — ABNORMAL LOW (ref 22–29)
CREATININE: 2.19 mg/dL — AB (ref 0.60–1.10)
Calcium: 9.1 mg/dL (ref 8.4–10.4)
Chloride: 99 mmol/L (ref 98–109)
GFR, Est AFR Am: 23 mL/min — ABNORMAL LOW (ref >60)
GFR, Estimated: 20 mL/min — ABNORMAL LOW (ref >60)
GLUCOSE: 93 mg/dL (ref 70–140)
POTASSIUM: 5 mmol/L (ref 3.5–5.1)
Sodium: 130 mmol/L — ABNORMAL LOW (ref 136–145)
TOTAL PROTEIN: 5.9 g/dL — AB (ref 6.4–8.3)

## 2017-11-05 LAB — CBC WITH DIFFERENTIAL (CANCER CENTER ONLY)
BASOS PCT: 0 %
Basophils Absolute: 0 10*3/uL (ref 0.0–0.1)
Eosinophils Absolute: 0.1 10*3/uL (ref 0.0–0.5)
Eosinophils Relative: 0 %
HEMATOCRIT: 29.4 % — AB (ref 34.8–46.6)
HEMOGLOBIN: 9.5 g/dL — AB (ref 11.6–15.9)
LYMPHS PCT: 4 %
Lymphs Abs: 0.6 10*3/uL — ABNORMAL LOW (ref 0.9–3.3)
MCH: 26.4 pg (ref 25.1–34.0)
MCHC: 32.3 g/dL (ref 31.5–36.0)
MCV: 81.7 fL (ref 79.5–101.0)
Monocytes Absolute: 1.3 10*3/uL — ABNORMAL HIGH (ref 0.1–0.9)
Monocytes Relative: 8 %
NEUTROS ABS: 14.3 10*3/uL — AB (ref 1.5–6.5)
NEUTROS PCT: 88 %
Platelet Count: 384 10*3/uL (ref 145–400)
RBC: 3.6 MIL/uL — ABNORMAL LOW (ref 3.70–5.45)
RDW: 24.1 % — ABNORMAL HIGH (ref 11.2–14.5)
WBC Count: 16.3 10*3/uL — ABNORMAL HIGH (ref 3.9–10.3)

## 2017-11-05 LAB — URIC ACID: Uric Acid, Serum: 4.9 mg/dL (ref 2.6–7.4)

## 2017-11-05 MED ORDER — SODIUM CHLORIDE 0.9 % IV SOLN
Freq: Once | INTRAVENOUS | Status: AC
  Administered 2017-11-05: 14:00:00 via INTRAVENOUS

## 2017-11-05 MED ORDER — SODIUM CHLORIDE 0.9% FLUSH
10.0000 mL | INTRAVENOUS | Status: DC | PRN
  Administered 2017-11-05: 10 mL
  Filled 2017-11-05: qty 10

## 2017-11-05 MED ORDER — HYDROCODONE-ACETAMINOPHEN 7.5-325 MG/15ML PO SOLN: ORAL | 0 refills | Status: AC

## 2017-11-05 MED ORDER — DRONABINOL 5 MG PO CAPS: 5.0000 mg | ORAL_CAPSULE | Freq: Two times a day (BID) | ORAL | 1 refills | Status: AC

## 2017-11-05 MED ORDER — HEPARIN SOD (PORK) LOCK FLUSH 100 UNIT/ML IV SOLN
500.0000 [IU] | Freq: Once | INTRAVENOUS | Status: AC | PRN
  Administered 2017-11-05: 500 [IU]
  Filled 2017-11-05: qty 5

## 2017-11-05 NOTE — Progress Notes (Signed)
[facility]  [phone] [phone]  Clinic Follow up Note   Patient Care Team: Willey Blade, MD as PCP - General (Internal Medicine) Ileana Roup, MD as Consulting Physician (General Surgery) Truitt Merle, MD as Consulting Physician (Hematology)  Date of Service: 11/05/17  CHIEF COMPLAINT: progressive weakness   SUMMARY OF ONCOLOGIC HISTORY: Oncology History   Cancer Staging Malignant neoplasm of ascending colon [facility]) Staging form: Colon and Rectum, AJCC 8th Edition - Clinical stage from 09/16/2017: Stage IVB (cTX, cNX, cM1b) - Signed by Truitt Merle, MD on 10/22/2017       Malignant neoplasm of ascending colon ([place])   09/16/2017 Procedure    Colonoscopy per Dr. Hilarie Fredrickson - The digital rectal exam was normal. - A fungating and ulcerated partially obstructing large mass was found in the distal ascending colon. The mass was circumferential. Oozing was present. The pediatric colonoscopy would not tranverse this lesion. This was biopsied with a cold forceps for histology. Area distal to the mass was tattooed with injections of total 5 mL of Spot (carbon black). - Multiple small and large-mouthed diverticula were found in the sigmoid colon, descending colon and transverse colon. - Internal hemorrhoids were found during retroflexion. The hemorrhoids were small.  IMPRESSION - Malignant partially obstructing tumor in the distal ascending colon. Biopsied. Tattooed. - Moderate diverticulosis in the sigmoid colon, in the descending colon and in the transverse colon. - Internal hemorrhoids.      09/16/2017 Procedure    EGD IMPRESSION - Normal esophagus. - Erythematous mucosa in the antrum. Biopsied. - Normal examined duodenum. Biopsied.        09/17/2017 Imaging    CT CAP IMPRESSION: 1. Circumferential mass of the ascending colon extending over a 5.7 cm length associated with prominent luminal narrowing and surrounding edema as well as  surrounding tumor nodularity. Adenopathy in the lower neck, chest, upper abdomen, retroperitoneum, and pelvis with scattered subcutaneous nodules in the chest, abdomen, and pelvis; a suspected intramuscular metastatic lesion along the right gluteus maximus; and a few soft tissue nodules in the omentum. This is an unusual pattern for metastatic spread of colon adenocarcinoma, and while I cannot be certain that the subcutaneous nodules represent active tumor, the unusual distribution does raise the possibility of melanoma or lymphoma with colon involvement, or some combination of malignancies. Small amount of ascites in the right paracolic gutter and pelvis. 2. Hypodensity along the falciform ligament is probably from focal fatty infiltration. No definite hepatic metastatic lesion is seen. 3. Other imaging findings of potential clinical significance: Aortic Atherosclerosis (ICD10-I70.0). Mild airway plugging in the right lower lobe. Trace right pleural effusion. Descending and sigmoid colon diverticulosis.       09/25/2017 Initial Diagnosis    Malignant neoplasm of ascending colon ([place])      09/26/2017 Pathology Results    Diagnosis Soft tissue, biopsy, anterior rt chest palpable subcutaneous nodule - POORLY DIFFERENTIATED CARCINOMA. Microscopic Comment Immunohistochemistry is performed and the tumor is positive with MOC-31, focally positive with CDX-2 and shows weak positivity with estrogen receptor. The tumor is negative with cytokeratin 7, cytokeratin 20, GATA-3, gross cystic disease fluid protein, progesterone receptor, Napsin A, thyroid transcription factor-1 and WT-1. The immunophenotype is consistent with adenocarcinoma, and the weak estrogen receptor positivity suggests breast or gynecologic primaries. Clinical correlation is essential.      10/17/2017 -  Chemotherapy    First line mFOLFOX, oxaliplatin was omitted for first cycle due to tumor lysis syndrome  10/21/2017 - 10/24/2017 Hospital Admission    Admit date: 10/21/17 Admission diagnosis: Due to worsening kidney fiunction, I admit pt to [facility] hospital today for further work up and management of her AKI under the hospitalist team care      10/28/2017 Surgery    Pt had a Cystoscopy with stent placement by Dr. Diona Fanti on 10/28/17.       10/31/2017 Imaging    [place] ABD COMPLETE IMPRESSION: 1. Marked dilatation of the common bile duct, measuring 14 mm. Recommend further evaluation with ERCP or MRCP. 2. Gallbladder sludge. 3. Mild bilateral hydronephrosis. Small, shadowing echogenic focus in the right renal central collecting system may represent a small calculus versus partial visualization of the double-J stent. 4. Small ascites. 5. Right pleural effusion.       11/04/2017 Imaging    MRI with MRCP WO contrast IMPRESSION: -New diffuse biliary ductal dilatation, with abrupt stricture of distal common bile duct. No definite pancreatic mass visualized on this unenhanced exam. Consider ERCP/EUS for further evaluation. -Choledocholithiasis. -No significant change in mild porta hepatis, mesenteric, and retroperitoneal lymphadenopathy. -Increased numerous soft tissue nodules throughout the abdominal wall subcutaneous fat, highly suspicious for metastatic disease. -New mild ascites and diffuse body wall edema. Increased small right pleural effusion. -Hemosiderosis.      CURRENT THERAPY: first line chemo mFOLFOX and Avastin, started on 10/17/2017, reduced dose for cycle 2 due to AKI. Cycle 2 held for hypobilirubinemia.   INTERVAL HISTORY: Ms. Trice returns today for follow up as scheduled reporting progressive weakness requiring assistance getting out of wheelchair and with all ADLs. She does not ambulate independently. Occasional dyspnea on exertion. Her son and daughter alternate round the clock care. Appetite is decreased, not much improvement on marinol 2.5 mg. Eats very little, mostly pureed  food. Son reports she has not tolerated solids in 3-4 weeks; having intermittent difficulty swallowing food and medication which she attributes to mucous in her throat. Feels full quickly. Mild nausea improved with compazine. No vomiting. Having small hard stools and feels constipated, on miralax. Reports gas and increasing abdominal pain. Tums helps some. Periodically takes 2 tylenol. She has frequent dark yellow urine when taking lasix, has not had a dose in 3 days. Denies dysuria or hematuria. Has 1 more dose of Keflex to complete per surgery.   REVIEW OF SYSTEMS:   Constitutional: Denies fevers, chills or abnormal weight loss (+) low appetite (+) fatigue  Eyes: Denies blurriness of vision Ears, nose, mouth, throat, and face: Denies mucositis or sore throat (+) mucous in throat Respiratory: Denies cough or wheezes (+) occasional dyspnea on exertion Cardiovascular: Denies palpitation, chest discomfort (+) lower extremity swelling Gastrointestinal:  Denies vomiting, diarrhea, heartburn or change in bowel habits (+) intermittent nausea, controlled with compazine (+) gas (+) abdominal pain (+) constipation, small hard stool on miralax (+) early satiety GU/GYN: Denies itching, burning, discharge, dysuria, or hematuria (+) urinary frequency on diuretic  Skin: Denies abnormal skin rashes (+) multiple subcutaneous nodules  Lymphatics: Denies easy bruising  Neurological:Denies numbness, tingling (+) progressive severe weakness  Behavioral/Psych: Mood is stable, no new changes  All other systems were reviewed with the patient and are negative.  MEDICAL HISTORY:  Past Medical History:  Diagnosis Date  . Anemia    iroin infusion - 07/2017   . Blood transfusion without reported diagnosis    IV Feraheme  . Cancer [facility])    colon cancer ; Malignant neoplasm of ascending colon; oncologist Dr. Burr Medico  dx 08-2017  . Chronic  kidney disease   . Edema    in feet started on 10/13/17- patient stated she elevated  feet and went away, call into PCP- Dr Karlton Lemon ; LE [place] on 10-23-17 neg for DVT  . Full dentures   . GERD (gastroesophageal reflux disease)   . History of acute renal failure    admission 10-21-17   . Hyperlipidemia   . Hypertension   . Macular degeneration   . Macular degeneration    receives eye injections every 6 months by Dr Ricki Miller at [facility]  . PONV (postoperative nausea and vomiting)   . Pre-diabetes   . Retinal break of left eye 2013    SURGICAL HISTORY: Past Surgical History:  Procedure Laterality Date  . ABDOMINAL HYSTERECTOMY  1977   partial   . CYSTOSCOPY WITH RETROGRADE PYELOGRAM, URETEROSCOPY AND STENT PLACEMENT Bilateral 10/28/2017   Procedure: CYSTOSCOPY WITH RETROGRADE PYELOGRAM,  AND STENT PLACEMENT;  Surgeon: Franchot Gallo, MD;  Location: [facility];  Service: Urology;  Laterality: Bilateral;  . PORTACATH PLACEMENT N/A 10/16/2017   Procedure: INSERTION PORT-A-CATH;  Surgeon: Ileana Roup, MD;  Location: [facility];  Service: General;  Laterality: N/A;    I have reviewed the social history and family history with the patient and they are unchanged from previous note.  ALLERGIES:  is allergic to feraheme [ferumoxytol]; codeine; and sulfur.  MEDICATIONS:  Current Outpatient Medications  Medication Sig Dispense Refill  . acetaminophen (TYLENOL) 500 MG tablet Take 500 mg by mouth every 6 (six) hours as needed for moderate pain or headache.    . allopurinol (ZYLOPRIM) 300 MG tablet Take 1 tablet (300 mg total) by mouth daily. 30 tablet 1  . dronabinol (MARINOL) 2.5 MG capsule Take 1 capsule (2.5 mg total) by mouth 2 (two) times daily before a meal. 60 capsule 1  . fenofibrate 160 MG tablet Take 160 mg by mouth daily.     . furosemide (LASIX) 20 MG tablet Take 1 tablet (20 mg total) by mouth every other day. 20 tablet 0  . lidocaine-prilocaine (EMLA) cream Apply to affected area once 30 g 3  . Nebivolol HCl (BYSTOLIC) 20 MG TABS Take 20 mg by  mouth daily.     . ondansetron (ZOFRAN) 8 MG tablet Take 1 tablet (8 mg total) by mouth 2 (two) times daily as needed for refractory nausea / vomiting. Start on day 3 after chemotherapy. 30 tablet 1  . oxybutynin (DITROPAN) 5 MG tablet Take 1 tablet (5 mg total) by mouth every 8 (eight) hours as needed for bladder spasms. 60 tablet 1  . Polyethylene Glycol 400 (BLINK TEARS OP) Place 1 drop into both eyes daily as needed (for dry eyes).    . potassium chloride SA (K-DUR,KLOR-CON) 20 MEQ tablet Take 1 tablet (20 mEq total) by mouth 2 (two) times daily. 20 tablet 0  . prochlorperazine (COMPAZINE) 10 MG tablet Take 1 tablet (10 mg total) by mouth every 6 (six) hours as needed (Nausea or vomiting). 30 tablet 1  . Cholecalciferol (VITAMIN D) 2000 UNITS tablet Take 2,000 Units by mouth daily.    . ciprofloxacin (CIPRO) 250 MG tablet Take 1 tablet (250 mg total) by mouth 2 (two) times daily. 14 tablet 0  . dronabinol (MARINOL) 5 MG capsule Take 1 capsule (5 mg total) by mouth 2 (two) times daily before lunch and supper. 60 capsule 1  . HYDROcodone-acetaminophen (HYCET) 7.5-325 mg/15 ml solution Take 5-10 mL every 6 hours as needed for pain 120  mL 0  . megestrol (MEGACE ES) 625 MG/5ML suspension Take 5 mLs (625 mg total) by mouth daily. 150 mL 0  . pantoprazole (PROTONIX) 40 MG tablet Take 1 tablet (40 mg total) by mouth daily. (Patient taking differently: Take 40 mg by mouth as needed. ) 30 tablet 1  . polyethylene glycol (MIRALAX / GLYCOLAX) packet Take 17 g by mouth daily as needed for moderate constipation.    . traMADol (ULTRAM) 50 MG tablet Take 0.5-1 tablets (25-50 mg total) by mouth every 6 (six) hours as needed. 15 tablet 0   No current facility-administered medications for this visit.    Facility-Administered Medications Ordered in Other Visits  Medication Dose Route Frequency Provider Last Rate Last Dose  . alteplase (CATHFLO ACTIVASE) injection 2 mg  2 mg Intracatheter Once PRN Truitt Merle, MD       . heparin lock flush 100 unit/mL  500 Units Intracatheter Once PRN Truitt Merle, MD      . heparin lock flush 100 unit/mL  500 Units Intracatheter Once PRN Alla Feeling, NP      . sodium chloride flush (NS) 0.9 % injection 10 mL  10 mL Intracatheter PRN Alla Feeling, NP        PHYSICAL EXAMINATION: ECOG PERFORMANCE STATUS: 3-4  Vitals:   11/05/17 1144  BP: 94/60  Pulse: 67  Resp: (!) 22  Temp: 98 F (36.7 C)  SpO2: 100%   GENERAL:alert, no distress, presents in wheelchair  SKIN: skin color, texture, turgor are normal, no rashes (+) diffuse 1-2 cm subcutaneous nodules at cheek, upper extremities, trunk with erythema. (+) two 1 cm ulcerated skin nodules below right breast, no discharge; covered with dressing c/d/i EYES: normal, Conjunctiva are pink and non-injected, (+) scleral icterus  OROPHARYNX:no exudate, no erythema and lips, buccal mucosa, and tongue normal  LYMPH:  (+) palpable cervical, supraclavicular, and axillary lymphadenopathy  LUNGS: clear to auscultation bilaterally with normal breathing effort HEART: regular rate & rhythm and no murmurs (+) 2+ bilateral lower extremity edema ABDOMEN:abdomen soft, non-distended, non-tender and normal bowel sounds. No hepatomegaly  Musculoskeletal:no cyanosis of digits and no clubbing  NEURO: alert & oriented x 3 with fluent speech MSK: generalized weakness PAC without erythema   LABORATORY DATA:  I have reviewed the data as listed CBC Latest Ref Rng & Units 11/05/2017 10/31/2017 10/24/2017  WBC 3.9 - 10.3 K/uL 16.3(H) 5.3 -  Hemoglobin 12.0 - 15.0 g/dL - - 8.5(L)  Hematocrit 34.8 - 46.6 % 29.4(L) 27.2(L) 25.6(L)  Platelets 145 - 400 K/uL 384 351 -     CMP Latest Ref Rng & Units 11/05/2017 10/31/2017 10/28/2017  Glucose 70 - 140 mg/dL 93 92 98  BUN 7 - 26 mg/dL 64(H) 33(H) 29(H)  Creatinine 0.60 - 1.10 mg/dL 2.19(H) 1.48(H) 1.73(H)  Sodium 136 - 145 mmol/L 130(L) 130(L) 132(L)  Potassium 3.5 - 5.1 mmol/L 5.0 4.6 4.5  Chloride 98  - 109 mmol/L 99 101 103  CO2 22 - 29 mmol/L 18(L) 18(L) 20(L)  Calcium 8.4 - 10.4 mg/dL 9.1 8.6 8.0(L)  Total Protein 6.4 - 8.3 g/dL 5.9(L) 5.8(L) -  Total Bilirubin 0.2 - 1.2 mg/dL 7.8(HH) 4.7(HH) -  Alkaline Phos 40 - 150 U/L 591(H) 497(H) -  AST 5 - 34 U/L 88(H) 72(H) -  ALT 0 - 55 U/L 66(H) 52 -      RADIOGRAPHIC STUDIES: I have personally reviewed the radiological images as listed and agreed with the findings in the report. No results  found.   ASSESSMENT & PLAN: Martesha Niedermeier is a pleasant 84 y.o. AA female with history of iron deficiency anemia and GERD now with colon mass, diffuse subcutaneous nodule and adenopathy   1. Weakness, deconditioning -Secondary to poor nutrition, dehydration, and underlying malignancy. She has become progressively weaker over 1 week. Requires assistance with ambulation, bathing, and all needs. Her son declines home health today but acknowledges the time may come soon when they will need assistance. He feels he and his sister can continue to provide care.  2. AKI secondary to tumor lysis syndrome and hydronephrosis -She developed TLS prior to beginning chemo, s/p rasburicase with cycle 1 on 10/17/17. She began allopurinol; uric acid normalized. Creatinine continued to fluctuate, ranging 1.4-1.9 lately. Now increased to 2.19, possibly related to recent lasix use, dehydration. Uric acid normal.   3. Hyperbilirubinemia -Abruptly elevated on 10/31/17. Liver US revealed marked dilatation of the common bile duct, measuring 14 mm. MRI with MRCP WO contrast on 11/04/17 showed new diffuse biliary ductal dilatation with abrupt stricture of distal common bile duct. We reviewed imaging results today. Plan to undergo ERCP with stenting 11/07/17.   4. Adenocarcinoma of ascending colon, with diffuse nodes and subcutaneous metastasis, cTxNxM1b, stage IVB, MSI-Stable, BRAF mutation (+) -Ms. Hosmer is stable for outpatient management. She completed 1 cycle FOLFOX on  10/17/17, she was admitted 4 days later on 10/21/17 for AKI. S/p cystoscopy and stent placement 10/28/17. On post-op keflex per Dr. Diona Fanti, she has 1 dose to complete today. Cycle 2 was held for acute hyperbilirubinemia. MRI with MRCP reveals biliary ductal dilatation with abrupt stricture of the distal common bile duct. No liver mass seen on non-contrast imaging. Bili and LFTs increased today; ERCP with stenting pending on 11/07/17.  -we reviewed imaging today, which also shows increased size and number of numerous soft tissue nodules throughout abdominal wall and subcutaneous fat as well as mild ascites and diffuse body wall edema. Does not appear distended on physical exam, does not need paracentesis at this time. -We discussed foundation one results; tumor is MSI-stable with BRAF mutation. She is not a candidate for immunotherapy. Should her condition improve, Dr. Burr Medico may consider dose-reduced FOLFIRINOX for her aggressive form of colon cancer.  -She has poor nutrition, dehydration, and overall poor performance status, she is not currently a candidate for systemic therapy. Will follow her condition closely.   -Labs reviewed, CBC with leukocytosis without obvious signs of infection, possibly related to malignancy.  -Continue supportive care today, 500 cc NS over 2 hours today. -F/u 3/18  5. HTN, HL, CKD stage III, GERD -she is having difficulty swallowing medication and has very little po intake. She does not tolerate medication well without food on her stomach. I reviewed home meds today, she will hold fenofibrate until her condition improves and she is better able to tolerate all her medication.  6. Goal of care discussion -She understands the incurable nature of her disease. The goal of therapy is palliative. Ms. Silberman' goal is to improve so she can continue chemotherapy. Will continue to monitor her condition and provide outpatient supportive care. I did explain that while chemotherapy is on hold  for hyperbilirubinemia and low performance status, her condition may worsen to the point she will not be a candidate to receive further anti-cancer treatment, at which point we would help support her comfort at home. She remains hopeful but understands her condition.   7. Anorexia -Previously on marinol 2.5 mg BID AC; increased to 5 mg  to help her appetite and overall nutrition.  -F/u nutrition next week  8. Leg edema -She began lasix every other day on 3/7 but only received 2 doses due to difficulty tolerating medication. Her son reports swelling did improve on lasix, but she still has significant LE edema. Cr. 2.19; will hold lasix until her renal function improves.   9. Hydronephrosis, s/p cystoscopy and stent placement 3/4 -Renal function remains compromised, Cr fluctuates. No hydronephrosis noted on MRI 11/04/17. Will monitor closely.   PLAN: -500 cc NS over 2 hours today -Hold lasix and potassium until renal function improves -Increased marinol to 5 mg BID AC, new prescription sent  -Prescription for Hycet  -ERCP with stent pending 11/07/17 -F/u 3/18 for monitoring and supportive care  All questions were answered. The patient knows to call the clinic with any problems, questions or concerns. No barriers to learning was detected. I spent 30 minutes counseling the patient face to face. The total time spent in the appointment was 40 minutes and more than 50% was on counseling and review of test results.     Alla Feeling, NP 11/07/17

## 2017-11-05 NOTE — Patient Instructions (Signed)
Dehydration, Adult Dehydration is when there is not enough fluid or water in your body. This happens when you lose more fluids than you take in. Dehydration can range from mild to very bad. It should be treated right away to keep it from getting very bad. Symptoms of mild dehydration may include:  Thirst.  Dry lips.  Slightly dry mouth.  Dry, warm skin.  Dizziness. Symptoms of moderate dehydration may include:  Very dry mouth.  Muscle cramps.  Dark pee (urine). Pee may be the color of tea.  Your body making less pee.  Your eyes making fewer tears.  Heartbeat that is uneven or faster than normal (palpitations).  Headache.  Light-headedness, especially when you stand up from sitting.  Fainting (syncope). Symptoms of very bad dehydration may include:  Changes in skin, such as: ? Cold and clammy skin. ? Blotchy (mottled) or pale skin. ? Skin that does not quickly return to normal after being lightly pinched and let go (poor skin turgor).  Changes in body fluids, such as: ? Feeling very thirsty. ? Your eyes making fewer tears. ? Not sweating when body temperature is high, such as in hot weather. ? Your body making very little pee.  Changes in vital signs, such as: ? Weak pulse. ? Pulse that is more than 100 beats a minute when you are sitting still. ? Fast breathing. ? Low blood pressure.  Other changes, such as: ? Sunken eyes. ? Cold hands and feet. ? Confusion. ? Lack of energy (lethargy). ? Trouble waking up from sleep. ? Short-term weight loss. ? Unconsciousness. Follow these instructions at home:  If told by your doctor, drink an ORS: ? Make an ORS by using instructions on the package. ? Start by drinking small amounts, about  cup (120 mL) every 5-10 minutes. ? Slowly drink more until you have had the amount that your doctor said to have.  Drink enough clear fluid to keep your pee clear or pale yellow. If you were told to drink an ORS, finish the ORS  first, then start slowly drinking clear fluids. Drink fluids such as: ? Water. Do not drink only water by itself. Doing that can make the salt (sodium) level in your body get too low (hyponatremia). ? Ice chips. ? Fruit juice that you have added water to (diluted). ? Low-calorie sports drinks.  Avoid: ? Alcohol. ? Drinks that have a lot of sugar. These include high-calorie sports drinks, fruit juice that does not have water added, and soda. ? Caffeine. ? Foods that are greasy or have a lot of fat or sugar.  Take over-the-counter and prescription medicines only as told by your doctor.  Do not take salt tablets. Doing that can make the salt level in your body get too high (hypernatremia).  Eat foods that have minerals (electrolytes). Examples include bananas, oranges, potatoes, tomatoes, and spinach.  Keep all follow-up visits as told by your doctor. This is important. Contact a doctor if:  You have belly (abdominal) pain that: ? Gets worse. ? Stays in one area (localizes).  You have a rash.  You have a stiff neck.  You get angry or annoyed more easily than normal (irritability).  You are more sleepy than normal.  You have a harder time waking up than normal.  You feel: ? Weak. ? Dizzy. ? Very thirsty.  You have peed (urinated) only a small amount of very dark pee during 6-8 hours. Get help right away if:  You have symptoms of   very bad dehydration.  You cannot drink fluids without throwing up (vomiting).  Your symptoms get worse with treatment.  You have a fever.  You have a very bad headache.  You are throwing up or having watery poop (diarrhea) and it: ? Gets worse. ? Does not go away.  You have blood or something green (bile) in your throw-up.  You have blood in your poop (stool). This may cause poop to look black and tarry.  You have not peed in 6-8 hours.  You pass out (faint).  Your heart rate when you are sitting still is more than 100 beats a  minute.  You have trouble breathing. This information is not intended to replace advice given to you by your health care provider. Make sure you discuss any questions you have with your health care provider. Document Released: 06/09/2009 Document Revised: 03/02/2016 Document Reviewed: 10/07/2015 Elsevier Interactive Patient Education  2018 [employer]  

## 2017-11-06 ENCOUNTER — Encounter: Payer: Self-pay | Admitting: Hematology

## 2017-11-06 ENCOUNTER — Telehealth: Payer: Self-pay | Admitting: Hematology

## 2017-11-06 MED ORDER — CIPROFLOXACIN HCL 250 MG PO TABS: 250.0000 mg | ORAL_TABLET | Freq: Two times a day (BID) | ORAL | 0 refills | Status: AC

## 2017-11-06 MED ORDER — MEGESTROL ACETATE 625 MG/5ML PO SUSP: 625.0000 mg | Freq: Every day | ORAL | 0 refills | Status: AC

## 2017-11-06 MED ORDER — TRAMADOL HCL 50 MG PO TABS: 25.0000 mg | ORAL_TABLET | Freq: Four times a day (QID) | ORAL | 0 refills | Status: AC | PRN

## 2017-11-06 NOTE — Telephone Encounter (Signed)
Pt's son Janet Barnes called my office this afternoon to update his mother's condition, and I called him back. Pt has not eaten today, very sleepy, is only able to get up to use bedside commode.  She is also has significant abdominal pain, cannot tolerate hydrocodone due to hallucination.  Janet Barnes and his sister has decided to cancel ERCP procedure tomorrow, but would like her to see me this Friday to discuss goal of care and hospice with pt in person. I will send a schedule message for that.   I called in tramadol for her to try.   Megace needs pre-hospitalization, I have called her insurance  for preauthorization, will have results within the next 24 hours.  Truitt Merle  11/06/2017

## 2017-11-06 NOTE — Progress Notes (Signed)
Received PA request for Megace.  Submitted via Cover My Meds:  Janet Barnes (Key: [id number])   [employer] is reviewing your PA request. Typically an electronic response will be received within 72 hours. To check for an update later, open this request from your dashboard. You may close this dialog and return to your dashboard to perform other tasks.

## 2017-11-06 NOTE — Telephone Encounter (Signed)
My nurse practitioner Janet Barnes saw patient yesterday, and updated me her condition.  We are very concerned about her quick deterioration, she is unlikely going to be a candidate for chemotherapy.  Her foundation one showed BRAF mutation, which indicates a more aggressive cancer.  I reviewed her abdominal MRI findings with Dr. Ardis Hughs this morning, given the elevated white count, he is concerned about cholangitis secondary to the obstruction, and suggested Cipro.  I called in Cipro 250 mg twice daily for 7 days today.  I called the patient's son Janet Barnes and reviewed the above.  Janet Barnes understands the overall very poor prognosis, if his mother is too weak to go to the ERCP procedure tomorrow, OK to cancel. We also discussed hospice referral if she is ready for it.  Janet Barnes voiced good understanding and will discuss with her mother.  Janet Barnes is concerned about her low appetite, difficulty swallowing pills.  I will change her Marinol to Megace liquid, prescription called into her pharmacy today.  Janet Barnes will touch base with [place] in the next few days to update her condition.  I plan to see her back early next week, but will make hospice referral if she is ready before her visit.  Truitt Merle  11/06/2017

## 2017-11-07 ENCOUNTER — Encounter: Payer: Self-pay | Admitting: Hematology

## 2017-11-07 ENCOUNTER — Ambulatory Visit (HOSPITAL_COMMUNITY): Admission: RE | Admit: 2017-11-07 | Payer: Medicare Other | Source: Ambulatory Visit | Admitting: Gastroenterology

## 2017-11-07 ENCOUNTER — Encounter (HOSPITAL_COMMUNITY): Payer: Self-pay | Admitting: Certified Registered Nurse Anesthetist

## 2017-11-07 ENCOUNTER — Telehealth: Payer: Self-pay | Admitting: *Deleted

## 2017-11-07 SURGERY — ENDOSCOPIC RETROGRADE CHOLANGIOPANCREATOGRAPHY (ERCP) WITH PROPOFOL
Anesthesia: General

## 2017-11-07 NOTE — Progress Notes (Signed)
Checked status of PA request for Megace via Cover My Meds:  Corinna Burkman Key: [id number] - PA Case ID: [id number] Need help? Call us at [phone]  Outcome  Approvedon March 13  Request Reference Number: [id number]. MEGESTROL SUS 625MG /72M is approved through 08/26/2018. For further questions, call [phone].  [facility](Renee) to advise of approval. She states it went through and they have ordered for the patient.

## 2017-11-07 NOTE — Telephone Encounter (Signed)
Received call from son Araceli Bouche stating that pt is deteriorating rapidly, and inquired about hospice services - from conversation with Dr. Burr Medico yesterday.  Dr. Burr Medico notified. Spoke with Araceli Bouche, and informed him that hospice referral will be made today as ok per Dr. Burr Medico.   Daughter Fraser Din was at the home with Araceli Bouche, and stated she would like to be the contact person for hospice. Both children stated they would like to keep appts for 3/15 for pt -  Per Abigail Butts, pt is not aware of hospice referral.   Abigail Butts stated she would like for Dr. Burr Medico to meet with pt and family at office visit , and to discuss about hospice referral. Spoke with Delsa Sale, new pt referral for [facility].   Referral made;  Dr. Burr Medico will be hospice attending;  Hospice providers to assist with symptoms management;  Activate hospice standing orders. [name]   Phone      [phone].

## 2017-11-07 NOTE — Progress Notes (Signed)
[place]  [phone] Fax:(336) [phone]  Clinic Follow up Note   Patient Care Team: Willey Blade, MD as PCP - General (Internal Medicine) Ileana Roup, MD as Consulting Physician (General Surgery) Truitt Merle, MD as Consulting Physician (Hematology)   Date of Service:  11/08/2017   CHIEF COMPLAINS: f/u metastatic colon cancer   SUMMARY OF ONCOLOGIC HISTORY: Oncology History   Cancer Staging Malignant neoplasm of ascending colon [facility]) Staging form: Colon and Rectum, AJCC 8th Edition - Clinical stage from 09/16/2017: Stage IVB (cTX, cNX, cM1b) - Signed by Truitt Merle, MD on 10/22/2017       Malignant neoplasm of ascending colon ([place])   09/16/2017 Procedure    Colonoscopy per Dr. Hilarie Fredrickson - The digital rectal exam was normal. - A fungating and ulcerated partially obstructing large mass was found in the distal ascending colon. The mass was circumferential. Oozing was present. The pediatric colonoscopy would not tranverse this lesion. This was biopsied with a cold forceps for histology. Area distal to the mass was tattooed with injections of total 5 mL of Spot (carbon black). - Multiple small and large-mouthed diverticula were found in the sigmoid colon, descending colon and transverse colon. - Internal hemorrhoids were found during retroflexion. The hemorrhoids were small.  IMPRESSION - Malignant partially obstructing tumor in the distal ascending colon. Biopsied. Tattooed. - Moderate diverticulosis in the sigmoid colon, in the descending colon and in the transverse colon. - Internal hemorrhoids.      09/16/2017 Procedure    EGD IMPRESSION - Normal esophagus. - Erythematous mucosa in the antrum. Biopsied. - Normal examined duodenum. Biopsied.        09/17/2017 Imaging    CT CAP IMPRESSION: 1. Circumferential mass of the ascending colon extending over a 5.7 cm length associated with prominent luminal narrowing and surrounding  edema as well as surrounding tumor nodularity. Adenopathy in the lower neck, chest, upper abdomen, retroperitoneum, and pelvis with scattered subcutaneous nodules in the chest, abdomen, and pelvis; a suspected intramuscular metastatic lesion along the right gluteus maximus; and a few soft tissue nodules in the omentum. This is an unusual pattern for metastatic spread of colon adenocarcinoma, and while I cannot be certain that the subcutaneous nodules represent active tumor, the unusual distribution does raise the possibility of melanoma or lymphoma with colon involvement, or some combination of malignancies. Small amount of ascites in the right paracolic gutter and pelvis. 2. Hypodensity along the falciform ligament is probably from focal fatty infiltration. No definite hepatic metastatic lesion is seen. 3. Other imaging findings of potential clinical significance: Aortic Atherosclerosis (ICD10-I70.0). Mild airway plugging in the right lower lobe. Trace right pleural effusion. Descending and sigmoid colon diverticulosis.       09/25/2017 Initial Diagnosis    Malignant neoplasm of ascending colon ([place])      09/26/2017 Pathology Results    Diagnosis Soft tissue, biopsy, anterior rt chest palpable subcutaneous nodule - POORLY DIFFERENTIATED CARCINOMA. Microscopic Comment Immunohistochemistry is performed and the tumor is positive with MOC-31, focally positive with CDX-2 and shows weak positivity with estrogen receptor. The tumor is negative with cytokeratin 7, cytokeratin 20, GATA-3, gross cystic disease fluid protein, progesterone receptor, Napsin A, thyroid transcription factor-1 and WT-1. The immunophenotype is consistent with adenocarcinoma, and the weak estrogen receptor positivity suggests breast or gynecologic primaries. Clinical correlation is essential.      10/17/2017 -  Chemotherapy    First line mFOLFOX, oxaliplatin was omitted for first cycle due to tumor lysis syndrome  10/21/2017 - 10/24/2017 Hospital Admission    Admit date: 10/21/17 Admission diagnosis: Due to worsening kidney fiunction, I admit pt to [facility] hospital today for further work up and management of her AKI under the hospitalist team care      10/28/2017 Surgery    Pt had a Cystoscopy with stent placement by Dr. Diona Fanti on 10/28/17.       10/31/2017 Imaging    [place] ABD COMPLETE IMPRESSION: 1. Marked dilatation of the common bile duct, measuring 14 mm. Recommend further evaluation with ERCP or MRCP. 2. Gallbladder sludge. 3. Mild bilateral hydronephrosis. Small, shadowing echogenic focus in the right renal central collecting system may represent a small calculus versus partial visualization of the double-J stent. 4. Small ascites. 5. Right pleural effusion.       11/04/2017 Imaging    MRI with MRCP WO contrast IMPRESSION: -New diffuse biliary ductal dilatation, with abrupt stricture of distal common bile duct. No definite pancreatic mass visualized on this unenhanced exam. Consider ERCP/EUS for further evaluation. -Choledocholithiasis. -No significant change in mild porta hepatis, mesenteric, and retroperitoneal lymphadenopathy. -Increased numerous soft tissue nodules throughout the abdominal wall subcutaneous fat, highly suspicious for metastatic disease. -New mild ascites and diffuse body wall edema. Increased small right pleural effusion. -Hemosiderosis.       HISTORY OF PRESENT ILLNESS (09/24/2017) Janet Barnes 84 y.o. female is here because of newly diagnosed cancer of the ascending colon. She was in her usual state of health until she developed bronchitis late December 2018. She was prescribed cough syrup but only took 2 doses because she noted decreased appetite and fatigue after taking it. Her bronchitis resolved but remained fatigued. She was due for her previously scheduled colonoscopy in January 2019 and developed stabbing left lower abdomina pain after eating  with intermittent nausea and diarrhea. Labs performed at GI with significant anemia with low iron studies. Colonoscopy found a fungating and ulcerated partially obstructing large mass in the distal ascending colon which was circumferential. EGD with normal esophagus and duodenum but with erythematous mucosa in the gastric antrum which was biopsied. Staging CT CAP revealed cirfumferential mass in the ascending colon extending over a 5.7 cm length associated with prominent luminal narrowing and surrounding edema as well as surrounding tumor nodularity. Also notable for adenopathy in the lower neck, chest, upper abdomen, retroperitoneum and pelvis with scattered subcutaneous nodules in the chest, abdomen, and pelvis; there is suspected intramuscular metastatic lesion along the right gluteus maximus.   Past medical history is significant for anemia but has not previously been prescribed oral iron replacement and never received blood transfusion. She has history of GERD but had not been taking PPI lately, she has since restarted pantoprazole. Positive for HTN, kidney disease, hyperlipidemia, macular degeneration, and retinal detachment. She has had hysterectomy but no other abdominal surgeries. Family history is strongly positive for colon cancer in her mother, pancreatic cancer in her sister. She has two children who are alive and healthy. She continues to be active in her community with church work and goes to the gym 4 times per week; she drives her self, lives alone, and is independent of all ADLs. Her children live out of town but are here for consult today. Daughter has ample time off with work if she should need more care during course of treatment.   Today she feels well overall. Denies abdominal pain at this time as it occurs mostly at night when she lies down. Does not want to take norco due to  hydrocodone component. Reports 3 recent episodes of blood in stool without frank red blood per rectum. She  received 2 doses of IV feraheme on 08/29/17 and 09/06/17, tolerated the first infusion but developed dyspnea, itching, nausea, and multiple subcutaneous skin nodules later in the day after second Feraheme infusion.  Denies vomiting, dysphagia, or weight loss. She is most concerned about multiple nontender skin nodules that appeared the day of her 2nd Feraheme infusion, one on her chin appears to be enlarging.  CURRENT THERAPY: supportive care   INTERVAL HISTORY:  Janet Barnes is here for a follow up.  She presents to my clinic with her son Araceli Bouche and daughter.  She came in a wheelchair.  She is lethargic, arousable, able to open eyes, and answer a few simple questions with yes or no with very low and vague voice, not able to follow a conversation and respond appropriately.  She states she is in pain, but not able to tell me where her pain is.  Does not seem to be in significant distress.  I have spoken to her son and daughter several times yesterday regarding her condition, she is clearly deteriorating rapidly in the past week.  She is able to drink some water, but has not eaten much.  Not able to swallow pills. She has been laying in a recliner at home, hardly gets up.   REVIEW OF SYSTEMS:   The patient is not able to do most questions, see interval history  MEDICAL HISTORY:  Past Medical History:  Diagnosis Date  . Anemia    iroin infusion - 07/2017   . Blood transfusion without reported diagnosis    IV Feraheme  . Cancer [facility])    colon cancer ; Malignant neoplasm of ascending colon; oncologist Dr. Burr Medico  dx 08-2017  . Chronic kidney disease   . Edema    in feet started on 10/13/17- patient stated she elevated feet and went away, call into PCP- Dr Karlton Lemon ; LE [place] on 10-23-17 neg for DVT  . Full dentures   . GERD (gastroesophageal reflux disease)   . History of acute renal failure    admission 10-21-17   . Hyperlipidemia   . Hypertension   . Macular degeneration   . Macular degeneration     receives eye injections every 6 months by Dr Ricki Miller at [facility]  . PONV (postoperative nausea and vomiting)   . Pre-diabetes   . Retinal break of left eye 2013    SURGICAL HISTORY: Past Surgical History:  Procedure Laterality Date  . ABDOMINAL HYSTERECTOMY  1977   partial   . CYSTOSCOPY WITH RETROGRADE PYELOGRAM, URETEROSCOPY AND STENT PLACEMENT Bilateral 10/28/2017   Procedure: CYSTOSCOPY WITH RETROGRADE PYELOGRAM,  AND STENT PLACEMENT;  Surgeon: Franchot Gallo, MD;  Location[facility];  Service: Urology;  Laterality: Bilateral;  . PORTACATH PLACEMENT N/A 10/16/2017   Procedure: INSERTION PORT-A-CATH;  Surgeon: Ileana Roup, MD;  Location: [facility];  Service: General;  Laterality: N/A;    I have reviewed the social history and family history with the patient and they are unchanged from previous note.  ALLERGIES:  is allergic to feraheme [ferumoxytol]; codeine; and sulfur.  MEDICATIONS:  Current Outpatient Medications  Medication Sig Dispense Refill  . acetaminophen (TYLENOL) 500 MG tablet Take 500 mg by mouth every 6 (six) hours as needed for moderate pain or headache.    . allopurinol (ZYLOPRIM) 300 MG tablet Take 1 tablet (300 mg total) by mouth daily. 30 tablet  1  . Cholecalciferol (VITAMIN D) 2000 UNITS tablet Take 2,000 Units by mouth daily.    . ciprofloxacin (CIPRO) 250 MG tablet Take 1 tablet (250 mg total) by mouth 2 (two) times daily. 14 tablet 0  . dronabinol (MARINOL) 2.5 MG capsule Take 1 capsule (2.5 mg total) by mouth 2 (two) times daily before a meal. 60 capsule 1  . dronabinol (MARINOL) 5 MG capsule Take 1 capsule (5 mg total) by mouth 2 (two) times daily before lunch and supper. 60 capsule 1  . fenofibrate 160 MG tablet Take 160 mg by mouth daily.     . furosemide (LASIX) 20 MG tablet Take 1 tablet (20 mg total) by mouth every other day. 20 tablet 0  . HYDROcodone-acetaminophen (HYCET) 7.5-325 mg/15 ml solution Take 5-10 mL every 6  hours as needed for pain 120 mL 0  . lidocaine-prilocaine (EMLA) cream Apply to affected area once 30 g 3  . megestrol (MEGACE ES) 625 MG/5ML suspension Take 5 mLs (625 mg total) by mouth daily. 150 mL 0  . Nebivolol HCl (BYSTOLIC) 20 MG TABS Take 20 mg by mouth daily.     . ondansetron (ZOFRAN) 8 MG tablet Take 1 tablet (8 mg total) by mouth 2 (two) times daily as needed for refractory nausea / vomiting. Start on day 3 after chemotherapy. 30 tablet 1  . oxybutynin (DITROPAN) 5 MG tablet Take 1 tablet (5 mg total) by mouth every 8 (eight) hours as needed for bladder spasms. 60 tablet 1  . pantoprazole (PROTONIX) 40 MG tablet Take 1 tablet (40 mg total) by mouth daily. (Patient taking differently: Take 40 mg by mouth as needed. ) 30 tablet 1  . polyethylene glycol (MIRALAX / GLYCOLAX) packet Take 17 g by mouth daily as needed for moderate constipation.    . Polyethylene Glycol 400 (BLINK TEARS OP) Place 1 drop into both eyes daily as needed (for dry eyes).    . potassium chloride SA (K-DUR,KLOR-CON) 20 MEQ tablet Take 1 tablet (20 mEq total) by mouth 2 (two) times daily. 20 tablet 0  . prochlorperazine (COMPAZINE) 10 MG tablet Take 1 tablet (10 mg total) by mouth every 6 (six) hours as needed (Nausea or vomiting). 30 tablet 1  . traMADol (ULTRAM) 50 MG tablet Take 0.5-1 tablets (25-50 mg total) by mouth every 6 (six) hours as needed. 15 tablet 0   No current facility-administered medications for this visit.    Facility-Administered Medications Ordered in Other Visits  Medication Dose Route Frequency Provider Last Rate Last Dose  . alteplase (CATHFLO ACTIVASE) injection 2 mg  2 mg Intracatheter Once PRN Truitt Merle, MD      . heparin lock flush 100 unit/mL  500 Units Intracatheter Once PRN Truitt Merle, MD      . heparin lock flush 100 unit/mL  500 Units Intracatheter Once PRN Alla Feeling, NP      . sodium chloride flush (NS) 0.9 % injection 10 mL  10 mL Intracatheter PRN Alla Feeling, NP         PHYSICAL EXAMINATION: ECOG PERFORMANCE STATUS: 4 Vitals:   11/08/17 1227  BP: (!) 126/57  Pulse: 75  Resp: 10  Temp: 97.7 F (36.5 C)  TempSrc: Oral  SpO2: 99%  Weight: 170 lb 3.2 oz (77.2 kg)    GENERAL:alert, no distress and comfortable SKIN: skin color, texture, turgor are normal, no rashes, (+) diffuse 1-2cm subcutaneous nodes at her left cheek, UE and trunk with mild skin redness (  possible skin invasion) but no open wounds (+) 2 palpable nodules measuring 2.7x2cm behind her right ear EYES: normal, Conjunctiva are pink and non-injected, sclera clear OROPHARYNX:no exudate, no erythema and lips, buccal mucosa, and tongue normal  NECK: supple, thyroid normal size, non-tender, without nodularity LYMPH:  (+) palpable cervical, supraclavicular, and axillarylymphadenopathy 1-2cm  LUNGS: clear to auscultation and percussion with normal breathing effort HEART: regular rate & rhythm and no murmurs and no lower extremity edema ABDOMEN:abdomen soft, non-tender and normal bowel sounds Musculoskeletal:no cyanosis of digits and no clubbing  NEURO: alert & oriented x 3 with fluent speech, no focal motor/sensory deficits  LABORATORY DATA:  CBC Latest Ref Rng & Units 11/08/2017 11/05/2017 10/31/2017  WBC 3.9 - 10.3 K/uL 27.1(H) 16.3(H) 5.3  Hemoglobin 12.0 - 15.0 g/dL - - -  Hematocrit 34.8 - 46.6 % 34.9 29.4(L) 27.2(L)  Platelets 145 - 400 K/uL 314 384 351   CMP Latest Ref Rng & Units 11/08/2017 11/05/2017 10/31/2017  Glucose 70 - 140 mg/dL 112 93 92  BUN 7 - 26 mg/dL 86(H) 64(H) 33(H)  Creatinine 0.60 - 1.10 mg/dL 2.72(H) 2.19(H) 1.48(H)  Sodium 136 - 145 mmol/L 134(L) 130(L) 130(L)  Potassium 3.5 - 5.1 mmol/L 5.5(H) 5.0 4.6  Chloride 98 - 109 mmol/L 100 99 101  CO2 22 - 29 mmol/L 19(L) 18(L) 18(L)  Calcium 8.4 - 10.4 mg/dL 9.4 9.1 8.6  Total Protein 6.4 - 8.3 g/dL 6.4 5.9(L) 5.8(L)  Total Bilirubin 0.2 - 1.2 mg/dL 9.7(HH) 7.8(HH) 4.7(HH)  Alkaline Phos 40 - 150 U/L 630(H) 591(H)  497(H)  AST 5 - 34 U/L 81(H) 88(H) 72(H)  ALT 0 - 55 U/L 70(H) 66(H) 52    RADIOGRAPHIC STUDIES: I have personally reviewed the radiological images as listed and agreed with the findings in the report.  No new images    ASSESSMENT & PLAN:  Janet Barnes is a pleasant 84 y.o. AA female with history of iron deficiency anemia and GERD now with colon mass, diffuse subcutaneous nodule and adenopathy   1. Metastatic colon cancer, BRAF mutation (+) 2.  Worsening hyperbilirubinemia, secondary to malignancy 3.  Worsening AKI, secondary to tumor lysis syndrome, hydronephrosis, and dehydration 4.  Metabolic encephalopathy 5.  Severe malnutrition 6.  Anorexia and weight loss  PLAN: -Janet Barnes unfortunately has been deteriorating rapidly in the past few weeks, especially in the past 3-4 days, she is not eating, does not drink much, very lethargic. I think she is likely going to die in a few days to a few weeks.  -She is clearly not a candidate for chemotherapy.  I have discussed that with pt and her her children, they are in agreement -I recommend hospice, she is likely a candidate for inpatient hospice.  I strongly encourage patient and her children to consider.  Her son and daughter has been overwhelmed by her rapid deterioration and, the intensive care she needs at home.  We have referred her to hospice yesterday, they plan to meet this afternoon. -After lengthy discussion, patient seems to be agreeable with hospice, although I really do not think she is alert enough to make clear decisions. Her son and daughter are all in complete agreement with hospice, and likely inpt hospice.  -I will review her medications with hospice team, and remain to be her MD if she will use hospice service at home -her son was very tearful, but very much appreciates our care.   I spent 20 minutes counseling the patient face to face. The  total time spent in the appointment was 25 minutes and more than 50% was on  counseling and review of test results  I spent a total of 40 minutes for her visit today, more than 50% time on face-to-face counseling.   Truitt Merle, MD 11/08/2017

## 2017-11-08 ENCOUNTER — Encounter: Payer: Self-pay | Admitting: Hematology

## 2017-11-08 ENCOUNTER — Inpatient Hospital Stay (HOSPITAL_BASED_OUTPATIENT_CLINIC_OR_DEPARTMENT_OTHER): Payer: Medicare Other | Admitting: Hematology

## 2017-11-08 ENCOUNTER — Inpatient Hospital Stay: Payer: Medicare Other

## 2017-11-08 VITALS — BP 126/57 | HR 75 | Temp 97.7°F | Resp 10 | Wt 170.2 lb

## 2017-11-08 DIAGNOSIS — R630 Anorexia: Secondary | ICD-10-CM

## 2017-11-08 DIAGNOSIS — C182 Malignant neoplasm of ascending colon: Secondary | ICD-10-CM | POA: Diagnosis not present

## 2017-11-08 DIAGNOSIS — Z7189 Other specified counseling: Secondary | ICD-10-CM

## 2017-11-08 DIAGNOSIS — N1330 Unspecified hydronephrosis: Secondary | ICD-10-CM

## 2017-11-08 DIAGNOSIS — C770 Secondary and unspecified malignant neoplasm of lymph nodes of head, face and neck: Secondary | ICD-10-CM

## 2017-11-08 DIAGNOSIS — E43 Unspecified severe protein-calorie malnutrition: Secondary | ICD-10-CM

## 2017-11-08 DIAGNOSIS — N179 Acute kidney failure, unspecified: Secondary | ICD-10-CM

## 2017-11-08 DIAGNOSIS — E883 Tumor lysis syndrome: Secondary | ICD-10-CM

## 2017-11-08 DIAGNOSIS — R634 Abnormal weight loss: Secondary | ICD-10-CM

## 2017-11-08 DIAGNOSIS — C792 Secondary malignant neoplasm of skin: Secondary | ICD-10-CM

## 2017-11-08 DIAGNOSIS — G9341 Metabolic encephalopathy: Secondary | ICD-10-CM

## 2017-11-08 LAB — CMP (CANCER CENTER ONLY)
ALK PHOS: 630 U/L — AB (ref 40–150)
ALT: 70 U/L — AB (ref 0–55)
AST: 81 U/L — AB (ref 5–34)
Albumin: 2.2 g/dL — ABNORMAL LOW (ref 3.5–5.0)
Anion gap: 15 — ABNORMAL HIGH (ref 3–11)
BILIRUBIN TOTAL: 9.7 mg/dL — AB (ref 0.2–1.2)
BUN: 86 mg/dL — ABNORMAL HIGH (ref 7–26)
CALCIUM: 9.4 mg/dL (ref 8.4–10.4)
CO2: 19 mmol/L — ABNORMAL LOW (ref 22–29)
CREATININE: 2.72 mg/dL — AB (ref 0.60–1.10)
Chloride: 100 mmol/L (ref 98–109)
GFR, EST AFRICAN AMERICAN: 18 mL/min — AB (ref >60)
GFR, EST NON AFRICAN AMERICAN: 15 mL/min — AB (ref >60)
Glucose, Bld: 112 mg/dL (ref 70–140)
Potassium: 5.5 mmol/L — ABNORMAL HIGH (ref 3.5–5.1)
Sodium: 134 mmol/L — ABNORMAL LOW (ref 136–145)
TOTAL PROTEIN: 6.4 g/dL (ref 6.4–8.3)

## 2017-11-08 LAB — CBC WITH DIFFERENTIAL (CANCER CENTER ONLY)
BASOS ABS: 0 10*3/uL (ref 0.0–0.1)
BASOS PCT: 0 %
EOS ABS: 0 10*3/uL (ref 0.0–0.5)
EOS PCT: 0 %
HCT: 34.9 % (ref 34.8–46.6)
Hemoglobin: 10.9 g/dL — ABNORMAL LOW (ref 11.6–15.9)
LYMPHS ABS: 1.2 10*3/uL (ref 0.9–3.3)
Lymphocytes Relative: 5 %
MCH: 27.3 pg (ref 25.1–34.0)
MCHC: 31.2 g/dL — ABNORMAL LOW (ref 31.5–36.0)
MCV: 87.3 fL (ref 79.5–101.0)
Monocytes Absolute: 0.6 10*3/uL (ref 0.1–0.9)
Monocytes Relative: 2 %
Neutro Abs: 25.3 10*3/uL — ABNORMAL HIGH (ref 1.5–6.5)
Neutrophils Relative %: 93 %
PLATELETS: 314 10*3/uL (ref 145–400)
RBC: 4 MIL/uL (ref 3.70–5.45)
RDW: 23.9 % — AB (ref 11.2–14.5)
WBC: 27.1 10*3/uL — AB (ref 3.9–10.3)
nRBC: 1 /100 WBC — ABNORMAL HIGH

## 2017-11-08 LAB — CEA (IN HOUSE-CHCC): CEA (CHCC-In House): 19.06 ng/mL — ABNORMAL HIGH (ref 0.00–5.00)

## 2017-11-08 LAB — URIC ACID: Uric Acid, Serum: 5.8 mg/dL (ref 2.6–7.4)

## 2017-11-08 LAB — IRON AND TIBC
IRON: 38 ug/dL — AB (ref 41–142)
SATURATION RATIOS: 24 % (ref 21–57)
TIBC: 156 ug/dL — ABNORMAL LOW (ref 236–444)
UIBC: 119 ug/dL

## 2017-11-08 LAB — FERRITIN: Ferritin: 601 ng/mL — ABNORMAL HIGH (ref 9–269)

## 2017-11-11 ENCOUNTER — Other Ambulatory Visit: Payer: Medicare Other

## 2017-11-11 ENCOUNTER — Ambulatory Visit: Payer: Medicare Other | Admitting: Hematology

## 2017-11-11 ENCOUNTER — Ambulatory Visit: Payer: Medicare Other

## 2017-11-13 ENCOUNTER — Telehealth: Payer: Self-pay | Admitting: *Deleted

## 2017-11-13 NOTE — Telephone Encounter (Signed)
Received call from [place], [place] re:   Pt expired on  11/08/2017 at  [place] am. [facility]       [phone].

## 2017-11-14 ENCOUNTER — Encounter: Payer: Medicare Other | Admitting: Nutrition

## 2017-11-14 ENCOUNTER — Ambulatory Visit: Payer: Medicare Other | Admitting: Nurse Practitioner

## 2017-11-14 ENCOUNTER — Other Ambulatory Visit: Payer: Medicare Other

## 2017-11-14 ENCOUNTER — Ambulatory Visit: Payer: Medicare Other

## 2017-11-25 DEATH — deceased

## 2017-11-28 ENCOUNTER — Ambulatory Visit: Payer: Medicare Other

## 2017-11-28 ENCOUNTER — Other Ambulatory Visit: Payer: Medicare Other

## 2017-11-28 ENCOUNTER — Ambulatory Visit: Payer: Medicare Other | Admitting: Hematology

## 2017-12-09 ENCOUNTER — Encounter (HOSPITAL_COMMUNITY): Payer: Self-pay | Admitting: Surgery

## 2018-01-17 ENCOUNTER — Telehealth: Payer: Self-pay | Admitting: Hematology

## 2018-01-17 NOTE — Telephone Encounter (Signed)
Printed ROI for Fraser Din patient's daughter on 01/17/18, Release ID [id number]

## 2018-06-02 IMAGING — MR MR MRCP
8 of 15 series · 22 of 48 positions shown · non-contrast
Comparison: Ultrasound on 10/31/2017 and CT on 09/17/2017

CLINICAL DATA: Abdominal pain and jaundice for several months.
Biliary ductal dilatation on recent ultrasound. Recently diagnosed
ascending colon carcinoma.

EXAM:
MRI ABDOMEN WITHOUT CONTRAST  (INCLUDING MRCP)
TECHNIQUE: Multiplanar multisequence MR imaging of the abdomen was performed.
Heavily T2-weighted images of the biliary and pancreatic ducts were
obtained, and three-dimensional MRCP images were rendered by post
processing.

[Series 3: T2 fat-sat · axial · 5.0mm · 0.78mm/px · z∈[-144,+85]mm · 2 of 47 slices shown (1 of 2)]
[im 1/47]
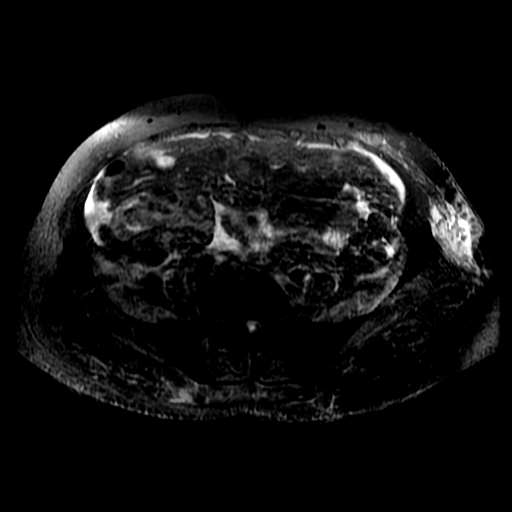
[im 47/47]
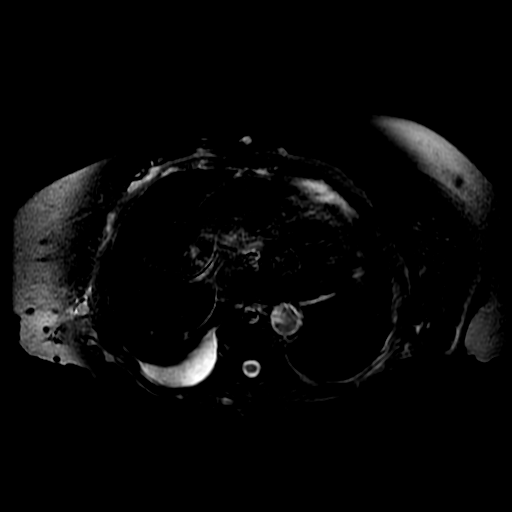

[Series 5: DWI b500 · axial · 6.0mm · 1.48mm/px · z∈[-146,+79]mm · 3 of 60 slices shown]
[im 1/60]
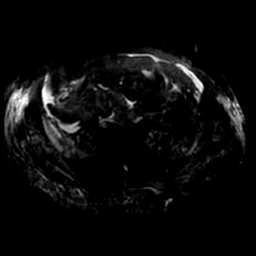
[im 30/60]
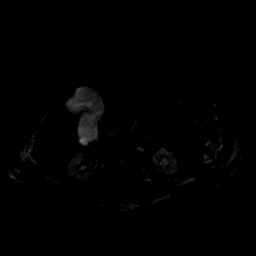
[im 60/60]
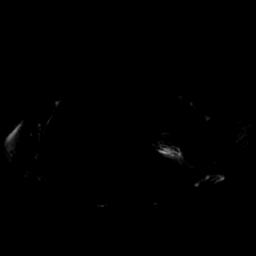

[Series 6: T2 · axial · 5.0mm · 0.78mm/px · z∈[-131,+68]mm · 3 of 41 slices shown]
[im 1/41]
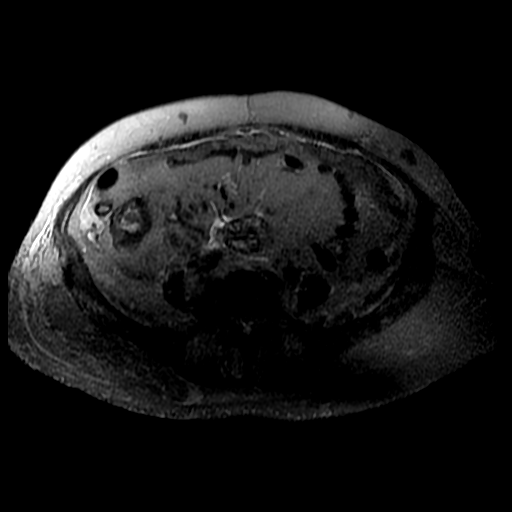
[im 21/41]
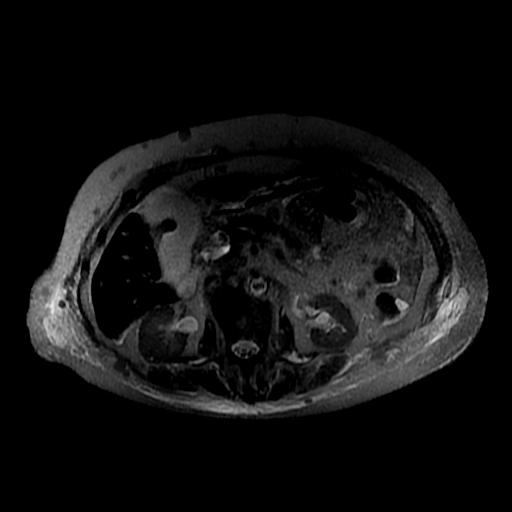
[im 41/41]
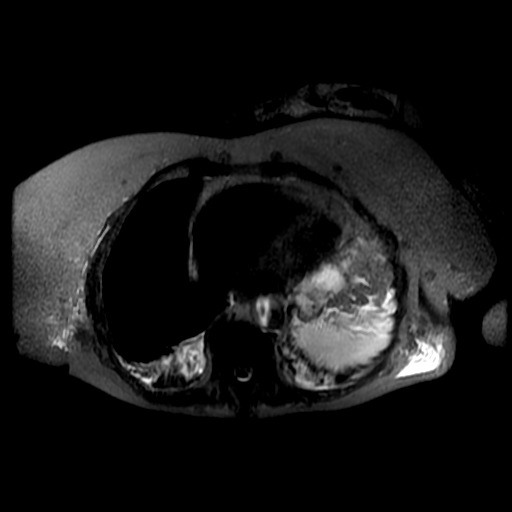

[Series 7: MRCP · coronal · 40.0mm · 0.70mm/px · 1 of 12 slices shown (1 of 2)]
[im 1/12]
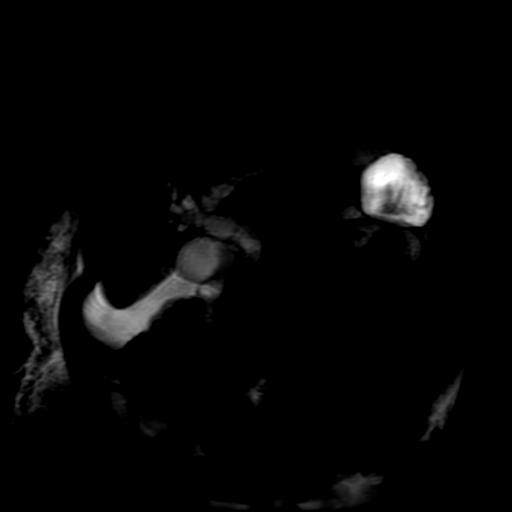

[Series 10: MRCP · coronal · 2.0mm · 0.70mm/px · 2 of 29 slices shown (2 of 2)]
[im 1/29]
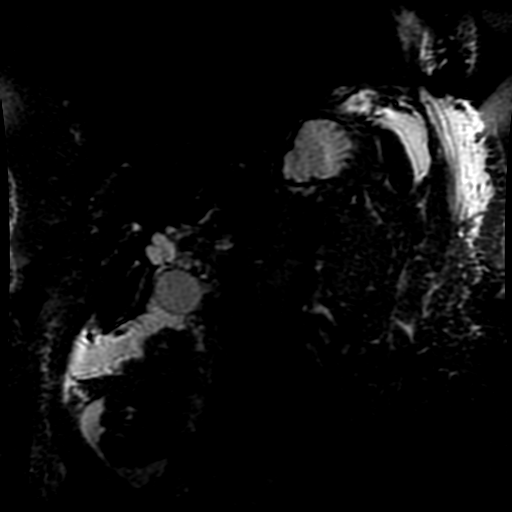
[im 29/29]
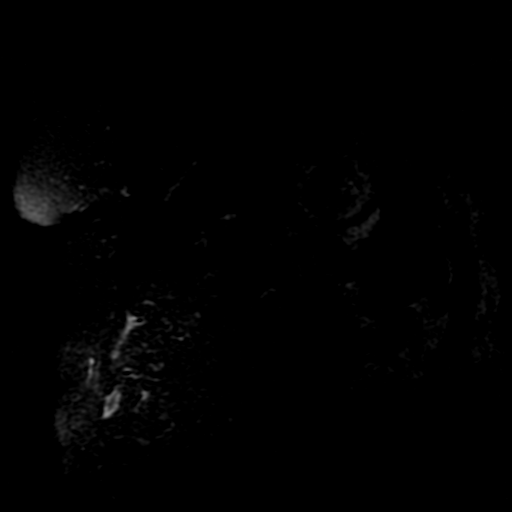

[Series 11: T2 fat-sat · axial · 5.0mm · 1.56mm/px · z∈[-144,+85]mm · 3 of 47 slices shown (2 of 2)]
[im 1/47]
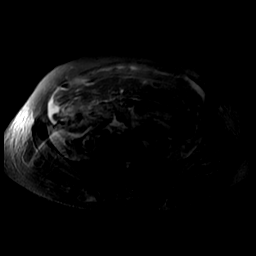
[im 24/47]
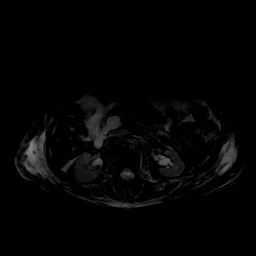
[im 47/47]
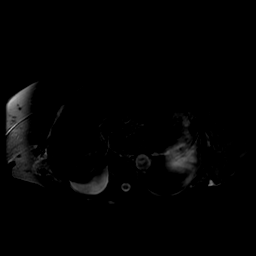

[Series 13: ax dualecho · axial · 5.0mm · 0.78mm/px · z∈[-115,+43]mm · 4 of 66 slices shown]
[im 1/66]
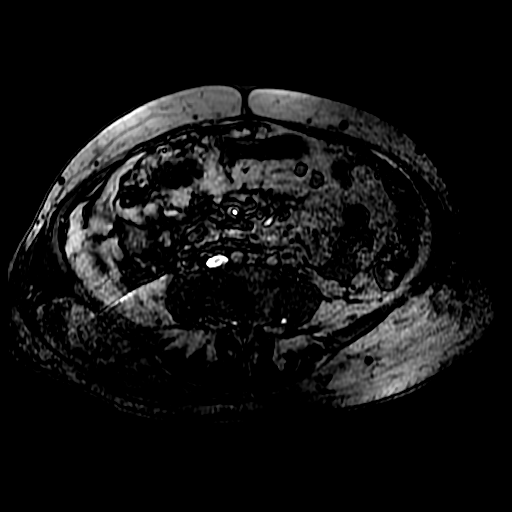
[im 22/66]
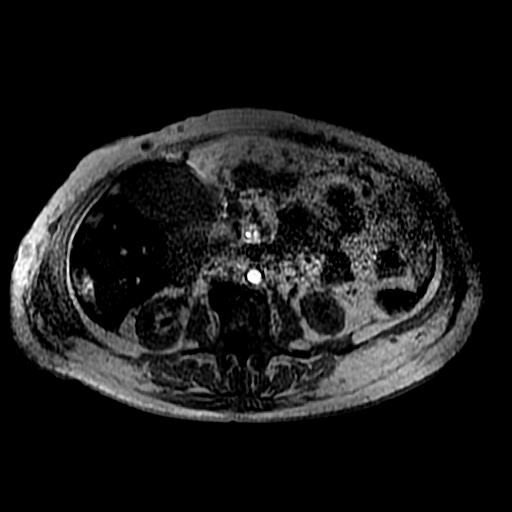
[im 44/66]
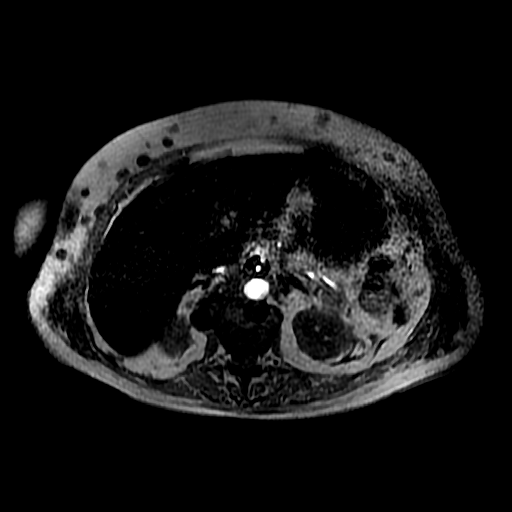
[im 66/66]
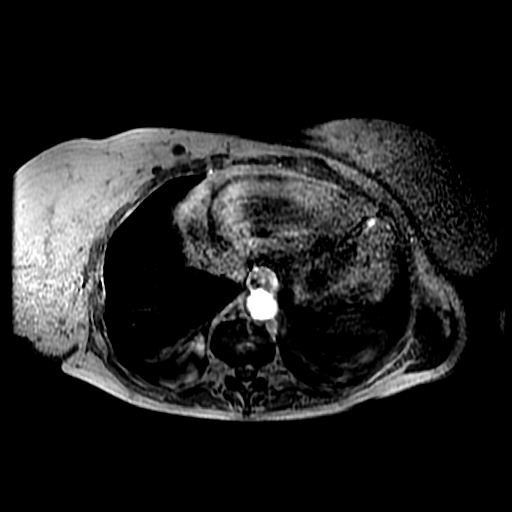

[Series 14: T1 dynamic · axial · 5.0mm · 0.78mm/px · z∈[-140,+7]mm · 4 of 80 slices shown]
[im 1/80]
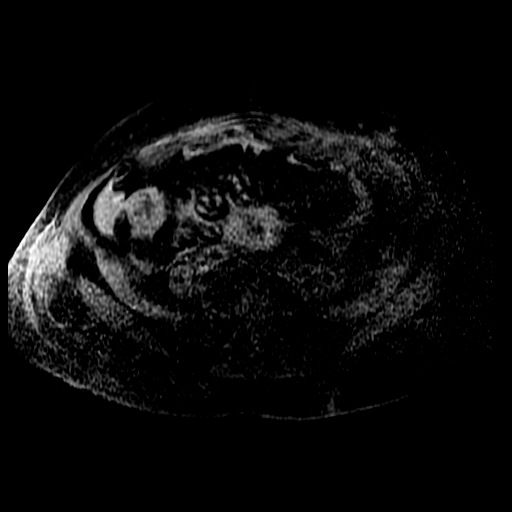
[im 20/80]
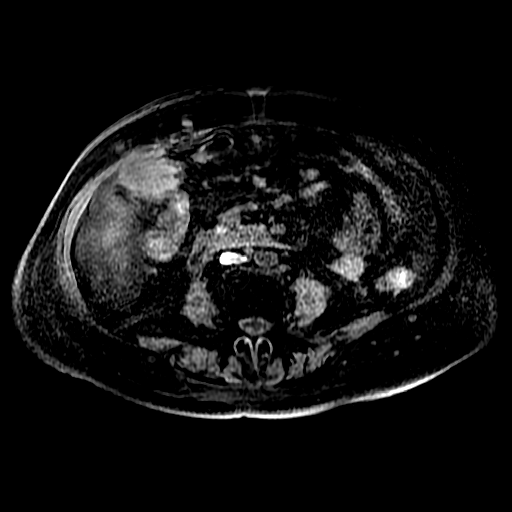
[im 40/80]
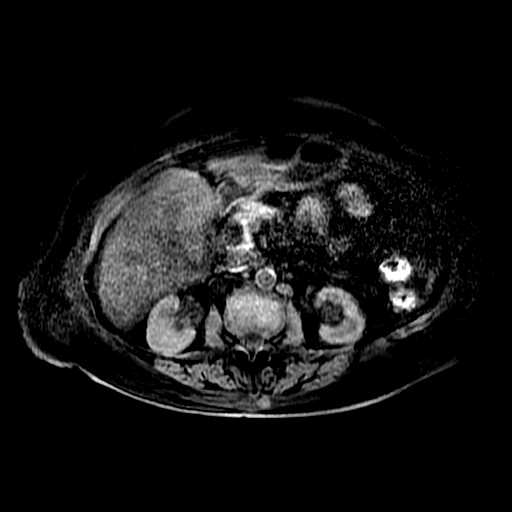
[im 60/80]
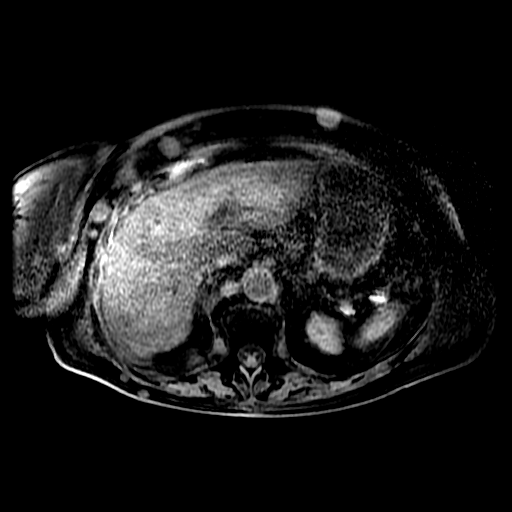

[22 of 48 positions shown; findings below may reference images not displayed]

FINDINGS: Lower chest: Increased size of small right pleural effusion, and
dependent bibasilar atelectasis.

Hepatobiliary: Diffuse T2 hypointensity of hepatic parenchyma,
consistent with hemosiderosis. No liver masses visualized on this
unenhanced exam. New mild ascites and diffuse body wall edema.

Gallbladder is distended and contains some layering sludge, however
there is no evidence of acute cholecystitis. Diffuse intra and
extrahepatic biliary ductal dilatation is seen, with common bile
duct measuring 17 mm in diameter. Abrupt stricture is seen involving
the distal common bile duct, and at least 2 calculi are seen in the
mid common bile duct, largest measuring 10 mm.

Pancreas: No definite pancreatic mass visualized on this unenhanced
exam. No evidence of pancreatic ductal dilatation.

Spleen: Within normal limits in size. Diffuse T2 hypointensity,
consistent with hemosiderosis.

Adrenals/Urinary tract: Unremarkable. No evidence of hydronephrosis.

Stomach/Bowel: Concentric wall thickening involving visualized
portion of proximal descending colon, consistent with known colon
carcinoma. No evidence of bowel obstruction.

Vascular/Lymphatic: Mild lymphadenopathy in small bowel mesentery,
porta hepatis, and retroperitoneum shows no significant change. No
evidence of abdominal aortic aneurysm.

Other: Increased size and number of numerous small soft tissue
nodules seen throughout the abdominal wall subcutaneous fat, highly
suspicious for metastatic disease.

Musculoskeletal: No suspicious bone lesions identified. Diffuse bone
marrow T2 hypointensity, consistent with hemosiderosis.
IMPRESSION: New diffuse biliary ductal dilatation, with abrupt stricture of
distal common bile duct. No definite pancreatic mass visualized on
this unenhanced exam. Consider ERCP/EUS for further evaluation.

Choledocholithiasis.

No significant change in mild porta hepatis, mesenteric, and
retroperitoneal lymphadenopathy.

Increased numerous soft tissue nodules throughout the abdominal wall
subcutaneous fat, highly suspicious for metastatic disease.

New mild ascites and diffuse body wall edema. Increased small right
pleural effusion.

Hemosiderosis.
